# Patient Record
Sex: Female | Born: 1940 | Race: White | Hispanic: No | Smoking: Never smoker
Health system: Southern US, Community
[De-identification: ages and names within clinical notes are randomized; demographics above are authoritative.]

## PROBLEM LIST (undated history)

## (undated) DIAGNOSIS — Z9889 Other specified postprocedural states: Secondary | ICD-10-CM

## (undated) DIAGNOSIS — E785 Hyperlipidemia, unspecified: Secondary | ICD-10-CM

## (undated) DIAGNOSIS — K222 Esophageal obstruction: Secondary | ICD-10-CM

## (undated) DIAGNOSIS — C801 Malignant (primary) neoplasm, unspecified: Secondary | ICD-10-CM

## (undated) DIAGNOSIS — I10 Essential (primary) hypertension: Secondary | ICD-10-CM

## (undated) DIAGNOSIS — H409 Unspecified glaucoma: Secondary | ICD-10-CM

## (undated) DIAGNOSIS — Z972 Presence of dental prosthetic device (complete) (partial): Secondary | ICD-10-CM

## (undated) DIAGNOSIS — R112 Nausea with vomiting, unspecified: Secondary | ICD-10-CM

## (undated) DIAGNOSIS — M5030 Other cervical disc degeneration, unspecified cervical region: Secondary | ICD-10-CM

## (undated) DIAGNOSIS — K219 Gastro-esophageal reflux disease without esophagitis: Secondary | ICD-10-CM

## (undated) DIAGNOSIS — G629 Polyneuropathy, unspecified: Secondary | ICD-10-CM

## (undated) DIAGNOSIS — S065X9A Traumatic subdural hemorrhage with loss of consciousness of unspecified duration, initial encounter: Secondary | ICD-10-CM

## (undated) HISTORY — PX: COLON SURGERY: SHX602

## (undated) HISTORY — PX: ABDOMINAL HYSTERECTOMY: SHX81

## (undated) HISTORY — PX: CHOLECYSTECTOMY: SHX55

## (undated) HISTORY — PX: APPENDECTOMY: SHX54

---

## 2004-06-13 ENCOUNTER — Ambulatory Visit: Payer: Self-pay | Admitting: Gastroenterology

## 2004-11-25 ENCOUNTER — Ambulatory Visit: Payer: Self-pay | Admitting: Unknown Physician Specialty

## 2005-10-25 ENCOUNTER — Emergency Department: Payer: Self-pay | Admitting: Emergency Medicine

## 2005-12-01 ENCOUNTER — Ambulatory Visit: Payer: Self-pay | Admitting: Unknown Physician Specialty

## 2006-12-03 ENCOUNTER — Ambulatory Visit: Payer: Self-pay | Admitting: Unknown Physician Specialty

## 2007-07-06 ENCOUNTER — Ambulatory Visit: Payer: Self-pay | Admitting: Gastroenterology

## 2008-02-28 ENCOUNTER — Ambulatory Visit: Payer: Self-pay | Admitting: Unknown Physician Specialty

## 2008-06-12 DIAGNOSIS — S065XAA Traumatic subdural hemorrhage with loss of consciousness status unknown, initial encounter: Secondary | ICD-10-CM

## 2008-06-12 DIAGNOSIS — S065X9A Traumatic subdural hemorrhage with loss of consciousness of unspecified duration, initial encounter: Secondary | ICD-10-CM

## 2008-06-12 HISTORY — DX: Traumatic subdural hemorrhage with loss of consciousness of unspecified duration, initial encounter: S06.5X9A

## 2008-06-12 HISTORY — DX: Traumatic subdural hemorrhage with loss of consciousness status unknown, initial encounter: S06.5XAA

## 2008-06-19 ENCOUNTER — Emergency Department: Payer: Self-pay

## 2009-03-12 ENCOUNTER — Ambulatory Visit: Payer: Self-pay | Admitting: Unknown Physician Specialty

## 2010-05-20 ENCOUNTER — Ambulatory Visit: Payer: Self-pay | Admitting: Unknown Physician Specialty

## 2010-07-09 ENCOUNTER — Ambulatory Visit: Payer: Self-pay | Admitting: Gastroenterology

## 2011-05-08 ENCOUNTER — Inpatient Hospital Stay: Payer: Self-pay | Admitting: Surgery

## 2011-05-14 LAB — PATHOLOGY REPORT

## 2011-08-27 ENCOUNTER — Ambulatory Visit: Payer: Self-pay | Admitting: Unknown Physician Specialty

## 2012-09-16 ENCOUNTER — Ambulatory Visit: Payer: Self-pay | Admitting: Physician Assistant

## 2012-09-22 ENCOUNTER — Ambulatory Visit: Payer: Self-pay | Admitting: Gastroenterology

## 2013-04-04 ENCOUNTER — Emergency Department: Payer: Self-pay | Admitting: Emergency Medicine

## 2013-09-26 ENCOUNTER — Ambulatory Visit: Payer: Self-pay | Admitting: Physician Assistant

## 2013-11-03 ENCOUNTER — Ambulatory Visit: Payer: Self-pay | Admitting: Gastroenterology

## 2014-05-29 ENCOUNTER — Emergency Department: Payer: Self-pay | Admitting: Emergency Medicine

## 2014-05-29 LAB — BASIC METABOLIC PANEL
ANION GAP: 8 (ref 7–16)
BUN: 17 mg/dL (ref 7–18)
CALCIUM: 8.6 mg/dL (ref 8.5–10.1)
Chloride: 109 mmol/L — ABNORMAL HIGH (ref 98–107)
Co2: 25 mmol/L (ref 21–32)
Creatinine: 1.07 mg/dL (ref 0.60–1.30)
EGFR (Non-African Amer.): 53 — ABNORMAL LOW
Glucose: 111 mg/dL — ABNORMAL HIGH (ref 65–99)
Osmolality: 285 (ref 275–301)
Potassium: 4 mmol/L (ref 3.5–5.1)
Sodium: 142 mmol/L (ref 136–145)

## 2014-05-29 LAB — URINALYSIS, COMPLETE
BACTERIA: NONE SEEN
BLOOD: NEGATIVE
Bilirubin,UR: NEGATIVE
Glucose,UR: NEGATIVE mg/dL (ref 0–75)
Hyaline Cast: 2
Ketone: NEGATIVE
NITRITE: NEGATIVE
Ph: 5 (ref 4.5–8.0)
Protein: NEGATIVE
RBC,UR: 2 /HPF (ref 0–5)
Specific Gravity: 1.017 (ref 1.003–1.030)
Squamous Epithelial: 2
WBC UR: 3 /HPF (ref 0–5)

## 2014-05-29 LAB — CBC WITH DIFFERENTIAL/PLATELET
Basophil #: 0 10*3/uL (ref 0.0–0.1)
Basophil %: 0.9 %
EOS PCT: 1.5 %
Eosinophil #: 0.1 10*3/uL (ref 0.0–0.7)
HCT: 45 % (ref 35.0–47.0)
HGB: 15.3 g/dL (ref 12.0–16.0)
Lymphocyte #: 2.2 10*3/uL (ref 1.0–3.6)
Lymphocyte %: 42.3 %
MCH: 32 pg (ref 26.0–34.0)
MCHC: 33.9 g/dL (ref 32.0–36.0)
MCV: 94 fL (ref 80–100)
MONOS PCT: 6.2 %
Monocyte #: 0.3 x10 3/mm (ref 0.2–0.9)
NEUTROS PCT: 49.1 %
Neutrophil #: 2.5 10*3/uL (ref 1.4–6.5)
PLATELETS: 194 10*3/uL (ref 150–440)
RBC: 4.77 10*6/uL (ref 3.80–5.20)
RDW: 12.8 % (ref 11.5–14.5)
WBC: 5.1 10*3/uL (ref 3.6–11.0)

## 2014-05-29 LAB — TROPONIN I: Troponin-I: <0.02 ng/mL

## 2014-06-06 ENCOUNTER — Emergency Department: Payer: Self-pay | Admitting: Emergency Medicine

## 2014-09-03 NOTE — Discharge Summary (Signed)
PATIENT NAME:  Tiffany Stafford, Tiffany Stafford MR#:  [id number] DATE OF BIRTH:  [birth year]  DATE OF ADMISSION:  05/08/2011 DATE OF DISCHARGE:  05/12/2011  ADMITTING PHYSICIAN: Dr. Rochel Brome   ADMITTING DIAGNOSIS: Acute cholecystitis and cholelithiasis.   DISCHARGE DIAGNOSIS: Acute suppurative cholecystitis and cholelithiasis.   PROCEDURE: Laparoscopic cholecystectomy with cholangiogram on 05/09/2011.   HISTORY OF PRESENT ILLNESS: This is a 74 year old female who presented with acute abdominal pain. She had ultrasound findings of gallstones, thickened gallbladder wall. Surgery was recommended for definitive treatment.   HOSPITAL COURSE: The patient was admitted and got a dose of IV Invanz. Surgery was set up for later in the day on 12/28. She did well with surgery and was readmitted to the floor for recovery. On postoperative day #1 she was feeling much improved. She did not have any fever or increased white blood cell count. The surgical culture did not have any growth but she did receive several doses of antibiotics while in the hospital. She was able to start clear liquids on the evening after surgery and gradually advanced to regular food. Pain was controlled and she was ambulating well. She had a drain in place after surgery that had minimal drainage and was removed prior to discharge.   DISPOSITION: Discharged home in good condition with self care.   MEDICATIONS:  1. Resume home estrogen. 2. Use Tylenol or Vicodin if needed for pain.   INSTRUCTIONS:  1. Remove dressings tomorrow. May shower. Reapply if needed.  2. Resume regular diet.  3. No heavy lifting.  4. Follow up next week.  5. Seek medical attention if you have any increasing abdominal pain, nausea, vomiting, fever, or other concerns.  ____________________________ Celene Squibb. Hartman [place], [place] amc:cms D: 06/09/2011 13:00:41 ET T: 06/09/2011 13:06:55 ET JOB#: [id number]  cc: Celene Squibb. Theda Sers, [place], <Dictator>  Chales Pelissier M Mekaela Azizi PA ELECTRONICALLY  SIGNED 06/09/2011 14:20

## 2014-09-03 NOTE — Op Note (Signed)
PATIENT NAME:  Tiffany Stafford, Tiffany Stafford MR#:  [id number] DATE OF BIRTH:  [birth year]  DATE OF PROCEDURE:  05/09/2011  PREOPERATIVE DIAGNOSIS: Acute cholecystitis, cholelithiasis.   POSTOPERATIVE DIAGNOSIS: Acute suppurative cholecystitis, cholelithiasis.  OPERATION PERFORMED: Laparoscopic cholecystectomy, cholangiogram.   SURGEON: Rochel Brome, MD  ANESTHESIA: General.   INDICATIONS: This 74 year old was admitted emergently with acute abdominal pain. She had ultrasound findings of gallstones, thickened gallbladder wall and surgery was recommended for definitive treatment.   DESCRIPTION OF PROCEDURE: The patient was placed on the operating table in the supine position under general anesthesia. The abdomen was prepared with ChloraPrep and draped in a sterile manner. The gallbladder and liver edge were below the coastal margin. An infraumbilical incision was made and carried down through subcutaneous tissues. The deep fascia was grasped with laryngeal hook and elevated. A Veress needle was inserted, aspirated, and irrigated with a saline solution. Next, the peritoneal cavity was inflated with carbon dioxide. The Veress needle was removed. The 10 mm cannula was inserted. The 10 mm, 0 degree laparoscope was inserted to view the peritoneal cavity. Another incision was made in the epigastrium just to the right of the midline to introduce an 11 mm cannula. Two incisions were made in the lateral aspect of the right upper quadrant to introduce two 5-mm cannulas.   A portion of omentum was dissected away from the gallbladder and also away from the liver. This was done with blunt and sharp dissection. The gallbladder is found to be markedly distended with thickened wall and acutely inflamed, had some purulent discharge surrounding it. The patient was given intravenous dose of Invanz due to findings of acute suppurative cholecystitis for therapeutic IV antibiotic. The gallbladder was retracted towards the right shoulder.  The infundibulum was retracted inferiorly and laterally. Location of the porta hepatis was demonstrated. The cystic duct was dissected free from surrounding structures and the cystic artery was dissected free from surrounding structures. A critical view of safety was demonstrated. An endoclip was placed across the cystic duct adjacent to the neck of the gallbladder. An opening was made in the cystic duct to introduce a Reddick catheter. Cholangiogram was done with injection of half-strength Conray-60 dye. The cholangiogram demonstrated flow of dye into the duodenum. There were no retained stones seen. The Reddick catheter was removed. The cystic duct was doubly ligated with endoclips and divided. The cystic artery was controlled with double endoclips and divided. The gallbladder was dissected free from the liver with hook and cautery and this site was irrigated with heparinized saline solution and aspirated. The gallbladder was brought up through the infraumbilical incision, opened and suctioned. There was purulent fluid within the gallbladder. It is noted that some of the purulent fluid was submitted for culture, which was actually aspirated before the Colbert Ewing was given. There were three large stones in the gallbladder. It was necessary to lengthen the incision to about 3 cm in length which allowed removal of the gallbladder with the large stones. These were sent for routine pathology. The fascial closure was begun with 0 Maxon and reinserted the 10 mm cannula. Brought the laparoscope back to the umbilical port. I reinsufflated the abdomen and re-examine the operative area and irrigated with heparinized saline solution and aspirated. Hemostasis appeared to be intact. Next, a Blake drain was inserted and brought out through the lower most 5 mm port site and was attached to the skin with 3-0 nylon, placed into the gallbladder fossa. The remainder of the cannulas were removed. Carbon dioxide was  allowed to escape from  the peritoneal cavity. Skin incisions were closed with interrupted 5-0 chromic subcuticular sutures, benzoin, and Steri-Strips. The drain was activated. The patient appeared to tolerate the procedure satisfactorily and was then prepared for transfer to the recovery room.  ____________________________ Lenna Sciara. Rochel Brome, MD jws:rbg D: 05/21/2011 19:43:42 ET T: 05/22/2011 08:57:49 ET JOB#: [id number]  cc: Loreli Dollar, MD, <Dictator> Loreli Dollar MD ELECTRONICALLY SIGNED 06/11/2011 18:12

## 2014-09-03 NOTE — H&P (Signed)
PATIENT NAME:  Tiffany Stafford, BLOUCH MR#:  [id number] DATE OF BIRTH:  August 08, 1940  DATE OF ADMISSION:  05/08/2011  HISTORY OF PRESENT ILLNESS: This 74 year old female came in with chief complaint of severe abdominal pain. She reports that over a period of months she has had occasional right upper quadrant abdominal pain but then two nights ago she developed pain which persisted, initially seemed to be somewhat intermitted but then later more persistent and all day yesterday, became worse last night. She went to the Emergency Room where she was evaluated and referred for general surgery admission. She further notes some nausea but no vomiting. She has had no chills or fever. No history of jaundice.   PAST MEDICAL HISTORY: She reports no history of any heart disease or lung disease. No history of diabetes. No history of hypertension. Does have some history of anxiety, occasionally takes some medicine for anxiety which she cannot recall the name.   PAST SURGICAL HISTORY:  1. Partial colectomy in 1995, which was a mass but was benign.  2. History of varicose vein surgery. 3. Hysterectomy and one ovary removed.  4. Appendectomy when she was a teenager.   SOCIAL HISTORY: Her husband died about 2-1/2 years ago. She does not smoke. Does not drink any alcohol.   FAMILY HISTORY: Positive for stomach cancer in her mother.   REVIEW OF SYSTEMS: Recently has been eating satisfactorily before the onset of pain. She reports last meal she had was yesterday. She had a ham sandwich for lunch and just a minimal amount of liquid since then. She reports no other recent acute illness such as cough, cold, or sore throat. No chest pain. No dyspnea on exertion. Has been moving her bowels satisfactorily and voiding satisfactorily. She reports no recent sores or boils. Does have some occasional problems with anxiety needing to take her medicine which she has not home. Has had just some mild soreness in her throat the last two days  but no fever. Review of systems otherwise negative.   CURRENT MEDICATIONS:  1. Estrogen. 2. Another medicine for anxiety which she takes p.r.n.   ALLERGIES: Sulfa.  PHYSICAL EXAMINATION: GENERAL: She is awake, alert, oriented resting in her hospital bed.   VITAL SIGNS: Temperature 98, pulse 87, respirations 18, blood pressure 115/65.   SKIN: Warm and dry without rash or jaundice.   HEENT: Pupils equal, reactive to light. Extraocular movements are intact. Sclerae clear. Pharynx clear. Has dentures.   NECK: No palpable mass.   LUNGS: Lungs sounds were clear. No respiratory distress.   HEART: Regular rhythm, S1 and S2.   ABDOMEN: Abdomen with right upper quadrant tenderness with guarding. There is no tenderness elsewhere in the abdomen.   EXTREMITIES: No dependent edema.   NEUROLOGIC: Awake, alert, oriented, moving all extremities.   LABORATORY, DIAGNOSTIC AND RADIOLOGICAL DATA: Comprehensive metabolic panel with elevated glucose at 138, slightly low albumin of 3.2. Troponin normal. White blood count 7000, hemoglobin 13.7.   Ultrasound demonstrates nonmobile gallstones and gallbladder wall thickening as much as 1 cm. There is pericholecystic fluid. Positive sonographic Murphy sign. Common bile duct 8.2 mm. Pancreatic head appeared normal.   IMPRESSION: Acute cholecystitis, cholelithiasis.   RECOMMENDATIONS: I recommended laparoscopic cholecystectomy. I discussed the operation, care, risks and benefits with her in detail and will get that set up to do later today. Have discussed with Operating Room staff and will keep her n.p.o. for surgery.  ____________________________ Lenna Sciara. Rochel Brome, MD jws:cms D: 05/09/2011 07:48:10 ET T: 05/09/2011 08:11:38  ET JOB#: [id number]  cc: Loreli Dollar, MD, <Dictator> Loreli Dollar MD ELECTRONICALLY SIGNED 06/06/2011 16:29

## 2014-09-19 ENCOUNTER — Emergency Department: Payer: Medicare Other

## 2014-09-19 ENCOUNTER — Encounter: Payer: Self-pay | Admitting: Emergency Medicine

## 2014-09-19 ENCOUNTER — Emergency Department
Admission: EM | Admit: 2014-09-19 | Discharge: 2014-09-19 | Disposition: A | Discharge status: Home / Self Care | Payer: Medicare Other | Attending: Student | Admitting: Student

## 2014-09-19 DIAGNOSIS — W11XXXA Fall on and from ladder, initial encounter: Secondary | ICD-10-CM | POA: Insufficient documentation

## 2014-09-19 DIAGNOSIS — Y92096 Garden or yard of other non-institutional residence as the place of occurrence of the external cause: Secondary | ICD-10-CM | POA: Insufficient documentation

## 2014-09-19 DIAGNOSIS — Y998 Other external cause status: Secondary | ICD-10-CM | POA: Diagnosis not present

## 2014-09-19 DIAGNOSIS — S82832A Other fracture of upper and lower end of left fibula, initial encounter for closed fracture: Secondary | ICD-10-CM | POA: Diagnosis not present

## 2014-09-19 DIAGNOSIS — S8255XA Nondisplaced fracture of medial malleolus of left tibia, initial encounter for closed fracture: Secondary | ICD-10-CM | POA: Diagnosis not present

## 2014-09-19 DIAGNOSIS — Y93E5 Activity, floor mopping and cleaning: Secondary | ICD-10-CM | POA: Insufficient documentation

## 2014-09-19 DIAGNOSIS — S82892A Other fracture of left lower leg, initial encounter for closed fracture: Secondary | ICD-10-CM

## 2014-09-19 DIAGNOSIS — S99912A Unspecified injury of left ankle, initial encounter: Secondary | ICD-10-CM | POA: Diagnosis present

## 2014-09-19 MED ORDER — TRAMADOL HCL 50 MG PO TABS: 50.0000 mg | ORAL_TABLET | Freq: Three times a day (TID) | ORAL | Status: DC | PRN

## 2014-09-19 NOTE — Discharge Instructions (Signed)
Take medication as prescribed. Keep elevated and apply ice. Use walker and keep in splint. Do not put weight on ankle.   Follow up with Dr Rudene Christians orthopedic next week as scheduled. Call to schedule.   Return to ER for new or worsening concerns.   Ankle Fracture A fracture is a break in a bone. The ankle joint is made up of three bones. These include the lower (distal)sections of your lower leg bones, called the tibia and fibula, along with a bone in your foot, called the talus. Depending on how bad the break is and if more than one ankle joint bone is broken, a cast or splint is used to protect and keep your injured bone from moving while it heals. Sometimes, surgery is required to help the fracture heal properly.  There are two general types of fractures:  Stable fracture. This includes a single fracture line through one bone, with no injury to ankle ligaments. A fracture of the talus that does not have any displacement (movement of the bone on either side of the fracture line) is also stable.  Unstable fracture. This includes more than one fracture line through one or more bones in the ankle joint. It also includes fractures that have displacement of the bone on either side of the fracture line. CAUSES  A direct blow to the ankle.   Quickly and severely twisting your ankle.  Trauma, such as a car accident or falling from a significant height. RISK FACTORS You may be at a higher risk of ankle fracture if:  You have certain medical conditions.  You are involved in high-impact sports.  You are involved in a high-impact car accident. SIGNS AND SYMPTOMS   Tender and swollen ankle.  Bruising around the injured ankle.  Pain on movement of the ankle.  Difficulty walking or putting weight on the ankle.  A cold foot below the site of the ankle injury. This can occur if the blood vessels passing through your injured ankle were also damaged.  Numbness in the foot below the site of the  ankle injury. DIAGNOSIS  An ankle fracture is usually diagnosed with a physical exam and X-rays. A CT scan may also be required for complex fractures. TREATMENT  Stable fractures are treated with a cast or splint and using crutches to avoid putting weight on your injured ankle. This is followed by an ankle strengthening program. Some patients require a special type of cast, depending on other medical problems they may have. Unstable fractures require surgery to ensure the bones heal properly. Your health care provider will tell you what type of fracture you have and the best treatment for your condition. HOME CARE INSTRUCTIONS   Review correct crutch use with your health care provider and use your crutches as directed. Safe use of crutches is extremely important. Misuse of crutches can cause you to fall or cause injury to nerves in your hands or armpits.  Do not put weight or pressure on the injured ankle until directed by your health care provider.  To lessen the swelling, keep the injured leg elevated while sitting or lying down.  Apply ice to the injured area:  Put ice in a plastic bag.  Place a towel between your cast and the bag.  Leave the ice on for 20 minutes, 2-3 times a day.  If you have a plaster or fiberglass cast:  Do not try to scratch the skin under the cast with any objects. This can increase your risk  of skin infection.  Check the skin around the cast every day. You may put lotion on any red or sore areas.  Keep your cast dry and clean.  If you have a plaster splint:  Wear the splint as directed.  You may loosen the elastic around the splint if your toes become numb, tingle, or turn cold or blue.  Do not put pressure on any part of your cast or splint; it may break. Rest your cast only on a pillow the first 24 hours until it is fully hardened.  Your cast or splint can be protected during bathing with a plastic bag sealed to your skin with medical tape. Do not  lower the cast or splint into water.  Take medicines as directed by your health care provider. Only take over-the-counter or prescription medicines for pain, discomfort, or fever as directed by your health care provider.  Do not drive a vehicle until your health care provider specifically tells you it is safe to do so.  If your health care provider has given you a follow-up appointment, it is very important to keep that appointment. Not keeping the appointment could result in a chronic or permanent injury, pain, and disability. If you have any problem keeping the appointment, call the facility for assistance. SEEK MEDICAL CARE IF: You develop increased swelling or discomfort. SEEK IMMEDIATE MEDICAL CARE IF:   Your cast gets damaged or breaks.  You have continued severe pain.  You develop new pain or swelling after the cast was put on.  Your skin or toenails below the injury turn blue or gray.  Your skin or toenails below the injury feel cold, numb, or have loss of sensitivity to touch.  There is a bad smell or pus draining from under the cast. MAKE SURE YOU:   Understand these instructions.  Will watch your condition.  Will get help right away if you are not doing well or get worse. Document Released: 04/25/2000 Document Revised: 05/03/2013 Document Reviewed: 11/25/2012 [facility] Patient Information 2015 [place], [place]. This information is not intended to replace advice given to you by your health care provider. Make sure you discuss any questions you have with your health care provider.

## 2014-09-19 NOTE — ED Notes (Signed)
Pt was cleaning gutters, missed step on ladder and fell in yard,  C/o pain left ankle, ankle swollen, feet warm and pink, + pedal pulse

## 2014-09-19 NOTE — ED Provider Notes (Signed)
[facility] Emergency Department Provider Note  ____________________________________________  Time seen: Approximately 3:10 PM  I have reviewed the triage vital signs and the nursing notes.   HISTORY  Chief Complaint Fall and Ankle Pain   HPI Tiffany Stafford is a 74 y.o. female pglass resents to the ER status post fall. Patient reports that she was outside cleaning her gutters on ladder. States she was coming down ladder, she was on the last step and as she stepped to the ground she mis stepped and rolled left ankle. States she stepped down on her left ankle twisted her left ankle and fell to her side. States no other injuries other than left ankle. Denies head injury loss of consciousness neck or back pain.Complains of left ankle pain.   States she was able to get self up and ambulate in to home. States pain aching at 7/10 with weight bearing. States 3/10 currently. Denies other pain.   Daughter also at bedside.   History reviewed. No pertinent past medical history.  There are no active problems to display for this patient.   Past Surgical History  Procedure Laterality Date  . Abdominal hysterectomy    . Cholecystectomy    . Appendectomy      No current outpatient prescriptions on file.  Allergies Sulfa antibiotics  History reviewed. No pertinent family history.  Social History History  Substance Use Topics  . Smoking status: Never Smoker   . Smokeless tobacco: Not on file  . Alcohol Use: No    Review of Systems Constitutional: No fever/chills Eyes: No visual changes. ENT: No sore throat. Cardiovascular: Denies chest pain. Respiratory: Denies shortness of breath. Gastrointestinal: No abdominal pain.  No nausea, no vomiting.  No diarrhea.  No constipation. Genitourinary: Negative for dysuria. Musculoskeletal: positive for left ankle pain. Negative for back pain. Skin: Negative for rash. Neurological: Negative for headaches, focal  weakness or numbness.  10-point ROS otherwise negative.  ____________________________________________   PHYSICAL EXAM:  VITAL SIGNS: ED Triage Vitals  Enc Vitals Group     BP 09/19/14 1348 158/96 mmHg     Pulse Rate 09/19/14 1348 69     Resp --      Temp 09/19/14 1348 97.9 F (36.6 C)     Temp Source 09/19/14 1348 Oral     SpO2 09/19/14 1348 100 %     Weight 09/19/14 1348 139 lb (63.05 kg)     Height 09/19/14 1348 5\' 5"  (1.651 m)     Head Cir --      Peak Flow --      Pain Score 09/19/14 1349 7     Pain Loc --      Pain Edu? --      Excl. in [place]? --     Constitutional: Alert and oriented. Well appearing and in no acute distress. Eyes: Conjunctivae are normal. PERRL. EOMI. Head: Atraumatic. Nose: No congestion/rhinnorhea. Mouth/Throat: Mucous membranes are moist.  Oropharynx non-erythematous. Neck: No stridor. No cervical spine tenderness to palpation. Hematological/Lymphatic/Immunilogical: No cervical lymphadenopathy. Cardiovascular: Normal rate, regular rhythm. Grossly normal heart sounds.  Good peripheral circulation. Respiratory: Normal respiratory effort.  No retractions. Lungs CTAB. Gastrointestinal: Soft and nontender. No distention. No abdominal bruits. No CVA tenderness. Musculoskeletal: No lower extremity tenderness nor edema.  No joint effusions. Except: Left medial and lateral ankle mod TTP, mod swelling. Skin intact. Distal pedal pulses intact and equal bilaterally. Pain with rotation.  Neurologic:  Normal speech and language. No gross focal neurologic deficits  are appreciated. Speech is normal. No gait instability. Skin:  Skin is warm, dry and intact. No rash noted. Psychiatric: Mood and affect are normal. Speech and behavior are normal.   RADIOLOGY  LEFT ANKLE COMPLETE - 3+ VIEW  COMPARISON: None.  FINDINGS: Frontal, oblique, and lateral views were obtained. There is a fracture of the medial malleolus in essentially anatomic alignment. There is a  comminuted fracture of the distal fibular diaphysis with major fracture fragments in near anatomic alignment. There is generalized soft tissue swelling, particularly laterally. The ankle mortise appears intact. There is no appreciable joint effusion.  IMPRESSION: Essentially nondisplaced fracture medial malleolus. Comminuted fracture distal fibular diaphysis with major fracture fragments in near anatomic alignment. Ankle mortise appears grossly intact.   Electronically Signed By: Lowella Grip III M.D. On: 09/19/2014 14:30  ____________________________________________   INITIAL IMPRESSION / ASSESSMENT AND PLAN / ED COURSE  Pertinent labs & imaging results that were available during my care of the patient were reviewed by me and considered in my medical decision making (see chart for details).  This is a well-appearing high function 74 year old female presents status post mechanical fall. Patient presents with left nondisplaced fracture medial malleolus as well as comminuted fracture distal fibular. Discussed with Dr. Rudene Christians at 1540. Per Dr. Rudene Christians recommends place patient in posterior as well as stirrup OCL splint. Patient to remain nonweightbearing using a walker. Patient to follow up with Dr. Rudene Christians and office on Monday.Pt and daughter verbalized understanding and plan.  ____________________________________________   FINAL CLINICAL IMPRESSION(S) / ED DIAGNOSES  Final diagnoses:  Ankle fracture, left     Marylene Land, NP 09/19/14 [place], NP 09/19/14 1615  Joanne Gavel, MD 09/19/14 2351

## 2015-02-21 ENCOUNTER — Other Ambulatory Visit: Payer: Self-pay | Admitting: Orthopedic Surgery

## 2015-02-21 DIAGNOSIS — G8929 Other chronic pain: Secondary | ICD-10-CM

## 2015-02-21 DIAGNOSIS — M25572 Pain in left ankle and joints of left foot: Principal | ICD-10-CM

## 2015-03-01 ENCOUNTER — Ambulatory Visit
Admission: RE | Admit: 2015-03-01 | Discharge: 2015-03-01 | Disposition: A | Discharge status: Home / Self Care | Payer: Medicare Other | Source: Ambulatory Visit | Attending: Orthopedic Surgery | Admitting: Orthopedic Surgery

## 2015-03-01 DIAGNOSIS — M25572 Pain in left ankle and joints of left foot: Secondary | ICD-10-CM | POA: Diagnosis present

## 2015-03-01 DIAGNOSIS — G8929 Other chronic pain: Secondary | ICD-10-CM

## 2015-03-01 DIAGNOSIS — X58XXXA Exposure to other specified factors, initial encounter: Secondary | ICD-10-CM | POA: Diagnosis not present

## 2015-03-01 DIAGNOSIS — S82452A Displaced comminuted fracture of shaft of left fibula, initial encounter for closed fracture: Secondary | ICD-10-CM | POA: Diagnosis not present

## 2015-04-11 ENCOUNTER — Encounter
Admission: RE | Admit: 2015-04-11 | Discharge: 2015-04-11 | Disposition: A | Discharge status: Home / Self Care | Payer: Medicare Other | Source: Ambulatory Visit | Attending: Orthopedic Surgery | Admitting: Orthopedic Surgery

## 2015-04-11 DIAGNOSIS — S82832K Other fracture of upper and lower end of left fibula, subsequent encounter for closed fracture with nonunion: Secondary | ICD-10-CM | POA: Diagnosis present

## 2015-04-11 DIAGNOSIS — Z0189 Encounter for other specified special examinations: Secondary | ICD-10-CM | POA: Insufficient documentation

## 2015-04-11 HISTORY — DX: Other specified postprocedural states: R11.2

## 2015-04-11 HISTORY — DX: Other specified postprocedural states: Z98.890

## 2015-04-11 NOTE — Patient Instructions (Signed)
  Your procedure is scheduled on: 04/17/15 Report to Day Surgery. To find out your arrival time please call [phone] between 1PM - 3PM on 04/16/15  Remember: Instructions that are not followed completely may result in serious medical risk, up to and including death, or upon the discretion of your surgeon and anesthesiologist your surgery may need to be rescheduled.    _x___ 1. Do not eat food or drink liquids after midnight. No gum chewing or hard candies.     __x__ 2. No Alcohol for 24 hours before or after surgery.   ____ 3. Bring all medications with you on the day of surgery if instructed.    _x___ 4. Notify your doctor if there is any change in your medical condition     (cold, fever, infections).     Do not wear jewelry, make-up, hairpins, clips or nail polish.  Do not wear lotions, powders, or perfumes. You may wear deodorant.  Do not shave 48 hours prior to surgery. Men may shave face and neck.  Do not bring valuables to the hospital.    [facility] is not responsible for any belongings or valuables.               Contacts, dentures or bridgework may not be worn into surgery.  Leave your suitcase in the car. After surgery it may be brought to your room.  For patients admitted to the hospital, discharge time is determined by your                treatment team.   Patients discharged the day of surgery will not be allowed to drive home.   Please read over the following fact sheets that you were given:   Surgical Site Infection Prevention   ____ Take these medicines the morning of surgery with A SIP OF WATER:    1.   2.   3.   4.  5.  6.  ____ Fleet Enema (as directed)   _x___ Use CHG Soap as directed  ____ Use inhalers on the day of surgery  ____ Stop metformin 2 days prior to surgery    ____ Take 1/2 of usual insulin dose the night before surgery and none on the morning of surgery.   ____ Stop Coumadin/Plavix/aspirin on   __x__ Stop Anti-inflammatories on   Tylenol only for pain before surgery   ____ Stop supplements until after surgery.    ____ Bring C-Pap to the hospital.

## 2015-04-17 ENCOUNTER — Ambulatory Visit: Payer: Medicare Other | Admitting: Registered Nurse

## 2015-04-17 ENCOUNTER — Ambulatory Visit
Admission: RE | Admit: 2015-04-17 | Discharge: 2015-04-17 | Disposition: A | Discharge status: Home / Self Care | Payer: Medicare Other | Source: Ambulatory Visit | Attending: Orthopedic Surgery | Admitting: Orthopedic Surgery

## 2015-04-17 ENCOUNTER — Ambulatory Visit: Payer: Medicare Other

## 2015-04-17 ENCOUNTER — Encounter
Admission: RE | Disposition: A | Discharge status: Home / Self Care | Payer: Self-pay | Source: Ambulatory Visit | Attending: Orthopedic Surgery

## 2015-04-17 DIAGNOSIS — Z9889 Other specified postprocedural states: Secondary | ICD-10-CM

## 2015-04-17 DIAGNOSIS — S82492K Other fracture of shaft of left fibula, subsequent encounter for closed fracture with nonunion: Secondary | ICD-10-CM | POA: Diagnosis present

## 2015-04-17 DIAGNOSIS — M1990 Unspecified osteoarthritis, unspecified site: Secondary | ICD-10-CM | POA: Insufficient documentation

## 2015-04-17 DIAGNOSIS — G629 Polyneuropathy, unspecified: Secondary | ICD-10-CM | POA: Insufficient documentation

## 2015-04-17 DIAGNOSIS — H409 Unspecified glaucoma: Secondary | ICD-10-CM | POA: Insufficient documentation

## 2015-04-17 DIAGNOSIS — Z85038 Personal history of other malignant neoplasm of large intestine: Secondary | ICD-10-CM | POA: Diagnosis not present

## 2015-04-17 DIAGNOSIS — Z8781 Personal history of (healed) traumatic fracture: Secondary | ICD-10-CM

## 2015-04-17 DIAGNOSIS — X58XXXD Exposure to other specified factors, subsequent encounter: Secondary | ICD-10-CM | POA: Diagnosis not present

## 2015-04-17 DIAGNOSIS — Z87820 Personal history of traumatic brain injury: Secondary | ICD-10-CM | POA: Diagnosis not present

## 2015-04-17 HISTORY — PX: ORIF ANKLE FRACTURE: SHX5408

## 2015-04-17 SURGERY — OPEN REDUCTION INTERNAL FIXATION (ORIF) ANKLE FRACTURE
Anesthesia: General | Laterality: Left

## 2015-04-17 MED ORDER — FAMOTIDINE 20 MG PO TABS
ORAL_TABLET | ORAL | Status: AC
  Filled 2015-04-17: qty 1

## 2015-04-17 MED ORDER — DEXAMETHASONE SODIUM PHOSPHATE 4 MG/ML IJ SOLN
INTRAMUSCULAR | Status: DC | PRN
  Administered 2015-04-17: 5 mg via INTRAVENOUS

## 2015-04-17 MED ORDER — FENTANYL CITRATE (PF) 100 MCG/2ML IJ SOLN
INTRAMUSCULAR | Status: AC
  Administered 2015-04-17: 25 ug via INTRAVENOUS
  Filled 2015-04-17: qty 2

## 2015-04-17 MED ORDER — FENTANYL CITRATE (PF) 100 MCG/2ML IJ SOLN
25.0000 ug | INTRAMUSCULAR | Status: AC | PRN
  Administered 2015-04-17 (×6): 25 ug via INTRAVENOUS

## 2015-04-17 MED ORDER — FENTANYL CITRATE (PF) 100 MCG/2ML IJ SOLN
INTRAMUSCULAR | Status: AC
Start: 2015-04-17 — End: 2015-04-17
  Administered 2015-04-17: 25 ug via INTRAVENOUS
  Filled 2015-04-17: qty 2

## 2015-04-17 MED ORDER — FAMOTIDINE 20 MG PO TABS
20.0000 mg | ORAL_TABLET | Freq: Once | ORAL | Status: AC
  Administered 2015-04-17: 20 mg via ORAL

## 2015-04-17 MED ORDER — HYDROCODONE-ACETAMINOPHEN 5-325 MG PO TABS: 1.0000 | ORAL_TABLET | ORAL | Status: DC | PRN

## 2015-04-17 MED ORDER — ONDANSETRON HCL 4 MG/2ML IJ SOLN: 4.0000 mg | Freq: Four times a day (QID) | INTRAMUSCULAR | Status: DC | PRN

## 2015-04-17 MED ORDER — ONDANSETRON HCL 4 MG/2ML IJ SOLN
INTRAMUSCULAR | Status: AC
  Filled 2015-04-17: qty 2

## 2015-04-17 MED ORDER — PROPOFOL 10 MG/ML IV BOLUS
INTRAVENOUS | Status: DC | PRN
  Administered 2015-04-17: 30 mg via INTRAVENOUS
  Administered 2015-04-17: 50 mg via INTRAVENOUS
  Administered 2015-04-17: 100 mg via INTRAVENOUS

## 2015-04-17 MED ORDER — ONDANSETRON HCL 4 MG/2ML IJ SOLN
4.0000 mg | Freq: Once | INTRAMUSCULAR | Status: AC | PRN
  Administered 2015-04-17: 4 mg via INTRAVENOUS

## 2015-04-17 MED ORDER — ONDANSETRON HCL 4 MG PO TABS: 4.0000 mg | ORAL_TABLET | Freq: Four times a day (QID) | ORAL | Status: DC | PRN

## 2015-04-17 MED ORDER — LIDOCAINE HCL (CARDIAC) 20 MG/ML IV SOLN
INTRAVENOUS | Status: DC | PRN
Start: 2015-04-17 — End: 2015-04-17
  Administered 2015-04-17: 50 mg via INTRAVENOUS

## 2015-04-17 MED ORDER — CEFAZOLIN SODIUM 1-5 GM-% IV SOLN
1.0000 g | Freq: Once | INTRAVENOUS | Status: AC
  Administered 2015-04-17: 1 g via INTRAVENOUS

## 2015-04-17 MED ORDER — PROMETHAZINE HCL 25 MG/ML IJ SOLN
INTRAMUSCULAR | Status: AC
  Filled 2015-04-17: qty 1

## 2015-04-17 MED ORDER — SODIUM CHLORIDE 0.9 % IV SOLN: INTRAVENOUS | Status: DC

## 2015-04-17 MED ORDER — FENTANYL CITRATE (PF) 100 MCG/2ML IJ SOLN
INTRAMUSCULAR | Status: DC | PRN
  Administered 2015-04-17 (×2): 50 ug via INTRAVENOUS

## 2015-04-17 MED ORDER — SODIUM CHLORIDE 0.9 % IJ SOLN
INTRAMUSCULAR | Status: AC
  Filled 2015-04-17: qty 10

## 2015-04-17 MED ORDER — ONDANSETRON HCL 4 MG/2ML IJ SOLN
INTRAMUSCULAR | Status: DC | PRN
  Administered 2015-04-17: 4 mg via INTRAVENOUS

## 2015-04-17 MED ORDER — GLYCOPYRROLATE 0.2 MG/ML IJ SOLN
INTRAMUSCULAR | Status: DC | PRN
  Administered 2015-04-17: 0.2 mg via INTRAVENOUS

## 2015-04-17 MED ORDER — CEFAZOLIN SODIUM 1-5 GM-% IV SOLN
INTRAVENOUS | Status: AC
  Filled 2015-04-17: qty 50

## 2015-04-17 MED ORDER — LACTATED RINGERS IV SOLN
INTRAVENOUS | Status: DC
  Administered 2015-04-17: 10:00:00 via INTRAVENOUS

## 2015-04-17 MED ORDER — METOCLOPRAMIDE HCL 10 MG PO TABS: 5.0000 mg | ORAL_TABLET | Freq: Three times a day (TID) | ORAL | Status: DC | PRN

## 2015-04-17 MED ORDER — NEOMYCIN-POLYMYXIN B GU 40-200000 IR SOLN
Status: AC
  Filled 2015-04-17: qty 4

## 2015-04-17 MED ORDER — PROMETHAZINE HCL 25 MG/ML IJ SOLN
6.2500 mg | Freq: Once | INTRAMUSCULAR | Status: AC
  Administered 2015-04-17: 6.25 mg via INTRAVENOUS

## 2015-04-17 MED ORDER — HYDROCODONE-ACETAMINOPHEN 5-325 MG PO TABS: 1.0000 | ORAL_TABLET | Freq: Four times a day (QID) | ORAL | Status: DC | PRN

## 2015-04-17 MED ORDER — METOCLOPRAMIDE HCL 5 MG/ML IJ SOLN: 5.0000 mg | Freq: Three times a day (TID) | INTRAMUSCULAR | Status: DC | PRN

## 2015-04-17 SURGICAL SUPPLY — 51 items
BANDAGE ACE 4X5 VEL STRL LF (GAUZE/BANDAGES/DRESSINGS) ×6 IMPLANT
BIT DRILL 2.5X2.75 QC CALB (BIT) ×3 IMPLANT
BLADE SURG SZ10 CARB STEEL (BLADE) ×6 IMPLANT
BNDG ESMARK 4X12 TAN STRL LF (GAUZE/BANDAGES/DRESSINGS) ×3 IMPLANT
CANISTER SUCT 1200ML W/VALVE (MISCELLANEOUS) ×3 IMPLANT
CHLORAPREP W/TINT 26ML (MISCELLANEOUS) ×3 IMPLANT
DRAPE FLUOR MINI C-ARM 54X84 (DRAPES) ×3 IMPLANT
DRAPE INCISE IOBAN 66X45 STRL (DRAPES) ×3 IMPLANT
DRAPE U-SHAPE 47X51 STRL (DRAPES) ×3 IMPLANT
DRSG EMULSION OIL 3X8 NADH (GAUZE/BANDAGES/DRESSINGS) ×3 IMPLANT
ELECT CAUTERY BLADE 6.4 (BLADE) ×3 IMPLANT
GAUZE PETRO XEROFOAM 1X8 (MISCELLANEOUS) ×3 IMPLANT
GAUZE SPONGE 4X4 12PLY STRL (GAUZE/BANDAGES/DRESSINGS) ×3 IMPLANT
GLOVE BIOGEL PI IND STRL 9 (GLOVE) ×1 IMPLANT
GLOVE BIOGEL PI INDICATOR 9 (GLOVE) ×2
GLOVE INDICATOR 7.5 STRL GRN (GLOVE) ×3 IMPLANT
GLOVE SURG ORTHO 9.0 STRL STRW (GLOVE) ×3 IMPLANT
GOWN SPECIALTY ULTRA XL (MISCELLANEOUS) ×3 IMPLANT
GOWN STRL REUS W/ TWL LRG LVL3 (GOWN DISPOSABLE) ×1 IMPLANT
GOWN STRL REUS W/TWL LRG LVL3 (GOWN DISPOSABLE) ×2
HEMOVAC 400ML (MISCELLANEOUS) ×3
KIT DRAIN HEMOVAC JP 7FR 400ML (MISCELLANEOUS) ×1 IMPLANT
KIT RM TURNOVER STRD PROC AR (KITS) ×3 IMPLANT
LABEL OR SOLS (LABEL) ×3 IMPLANT
NS IRRIG 1000ML POUR BTL (IV SOLUTION) ×3 IMPLANT
PACK EXTREMITY ARMC (MISCELLANEOUS) ×3 IMPLANT
PAD ABD DERMACEA PRESS 5X9 (GAUZE/BANDAGES/DRESSINGS) ×6 IMPLANT
PAD CAST CTTN 4X4 STRL (SOFTGOODS) ×2 IMPLANT
PAD GROUND ADULT SPLIT (MISCELLANEOUS) ×3 IMPLANT
PAD PREP 24X41 OB/GYN DISP (PERSONAL CARE ITEMS) ×3 IMPLANT
PADDING CAST COTTON 4X4 STRL (SOFTGOODS) ×4
PLATE ACE 100DEG 6HOLE (Plate) ×3 IMPLANT
SCREW CORTICAL 3.5MM  12MM (Screw) ×2 IMPLANT
SCREW CORTICAL 3.5MM  16MM (Screw) ×4 IMPLANT
SCREW CORTICAL 3.5MM 12MM (Screw) ×1 IMPLANT
SCREW CORTICAL 3.5MM 14MM (Screw) ×6 IMPLANT
SCREW CORTICAL 3.5MM 16MM (Screw) ×2 IMPLANT
SCREW CORTICAL 3.5MM 18MM (Screw) ×3 IMPLANT
SPLINT CAST 1 STEP 5X30 WHT (MISCELLANEOUS) ×3 IMPLANT
SPONGE LAP 18X18 5 PK (GAUZE/BANDAGES/DRESSINGS) ×3 IMPLANT
STAPLER SKIN PROX 35W (STAPLE) ×3 IMPLANT
STOCKINETTE STRL 6IN 960660 (GAUZE/BANDAGES/DRESSINGS) ×3 IMPLANT
SUT ETHILON 3-0 FS-10 30 BLK (SUTURE) ×3
SUT MNCRL AB 4-0 PS2 18 (SUTURE) ×6 IMPLANT
SUT VIC AB 0 CT1 36 (SUTURE) ×3 IMPLANT
SUT VIC AB 2-0 SH 27 (SUTURE) ×4
SUT VIC AB 2-0 SH 27XBRD (SUTURE) ×2 IMPLANT
SUT VIC AB 3-0 SH 27 (SUTURE) ×2
SUT VIC AB 3-0 SH 27X BRD (SUTURE) ×1 IMPLANT
SUTURE EHLN 3-0 FS-10 30 BLK (SUTURE) ×1 IMPLANT
SYRINGE 10CC LL (SYRINGE) ×3 IMPLANT

## 2015-04-17 NOTE — Discharge Instructions (Addendum)
Keep leg elevated much as possible. Weightbearing as tolerated on the left leg. Keep dressing clean and dry      - USE YOUR WALKER FOR SAFETY         AMBULATORY SURGERY  DISCHARGE INSTRUCTIONS   1) The drugs that you were given will stay in your system until tomorrow so for the next 24 hours you should not:  A) Drive an automobile B) Make any legal decisions C) Drink any alcoholic beverage   2) You may resume regular meals tomorrow.  Today it is better to start with liquids and gradually work up to solid foods.  You may eat anything you prefer, but it is better to start with liquids, then soup and crackers, and gradually work up to solid foods.   3) Please notify your doctor immediately if you have any unusual bleeding, trouble breathing, redness and pain at the surgery site, drainage, fever, or pain not relieved by medication. 4)   5) Your post-operative visit with Dr.                                     is: Date:                        Time:    Please call to schedule your post-operative visit.  6) Additional Instructions:

## 2015-04-17 NOTE — H&P (Signed)
Reviewed paper H+P, will be scanned into chart. No changes noted.  

## 2015-04-17 NOTE — Transfer of Care (Signed)
Immediate Anesthesia Transfer of Care Note  Patient: Tiffany Stafford  Procedure(s) Performed: Procedure(s): OPEN REDUCTION INTERNAL FIXATION (ORIF) ANKLE FRACTURE (Left)  Patient Location: PACU  Anesthesia Type:General  Level of Consciousness: sedated  Airway & Oxygen Therapy: Patient Spontanous Breathing and Patient connected to face mask oxygen  Post-op Assessment: Report given to RN and Post -op Vital signs reviewed and stable  Post vital signs: Reviewed and stable  Last Vitals:  Filed Vitals:   04/17/15 1003 04/17/15 1157  BP: 174/86 168/89  Pulse: 62 76  Temp: 36.2 C 36.4 C  Resp: 16 11    Complications: No apparent anesthesia complications

## 2015-04-17 NOTE — Anesthesia Preprocedure Evaluation (Addendum)
Anesthesia Evaluation  Patient identified by MRN, date of birth, ID band Patient awake    Reviewed: Allergy & Precautions, NPO status , Patient's Chart, lab work & pertinent test results, reviewed documented beta blocker date and time   History of Anesthesia Complications (+) PONV and history of anesthetic complications  Airway Mallampati: II  TM Distance: >3 FB     Dental  (+) Chipped   Pulmonary           Cardiovascular      Neuro/Psych    GI/Hepatic   Endo/Other    Renal/GU      Musculoskeletal   Abdominal   Peds  Hematology   Anesthesia Other Findings EKG ok, no cardiac problems. Neuropathy. Arthrits. Hx of colon ca, with resection. Hx of subdural hematoma years ago, ok now. Glaucoma with drops. VV.  Reproductive/Obstetrics                            Anesthesia Physical Anesthesia Plan  ASA: II  Anesthesia Plan: General and Spinal   Post-op Pain Management:    Induction: Intravenous  Airway Management Planned: LMA  Additional Equipment:   Intra-op Plan:   Post-operative Plan:   Informed Consent: I have reviewed the patients History and Physical, chart, labs and discussed the procedure including the risks, benefits and alternatives for the proposed anesthesia with the patient or authorized representative who has indicated his/her understanding and acceptance.     Plan Discussed with: CRNA  Anesthesia Plan Comments:         Anesthesia Quick Evaluation

## 2015-04-17 NOTE — Anesthesia Postprocedure Evaluation (Signed)
Anesthesia Post Note  Patient: Tiffany Stafford  Procedure(s) Performed: Procedure(s) (LRB): OPEN REDUCTION INTERNAL FIXATION (ORIF) ANKLE FRACTURE (Left)  Patient location during evaluation: PACU Anesthesia Type: General Level of consciousness: awake Pain management: pain level controlled Vital Signs Assessment: post-procedure vital signs reviewed and stable Respiratory status: spontaneous breathing Cardiovascular status: blood pressure returned to baseline Anesthetic complications: no    Last Vitals:  Filed Vitals:   04/17/15 1316 04/17/15 1344  BP:  157/82  Pulse: 73   Temp:  35.5 C  Resp: 13 16    Last Pain:  Filed Vitals:   04/17/15 1348  PainSc: [place]

## 2015-04-17 NOTE — Anesthesia Procedure Notes (Signed)
Procedure Name: LMA Insertion Performed by: Rolla Plate Pre-anesthesia Checklist: Patient identified, Patient being monitored, Timeout performed, Emergency Drugs available and Suction available Patient Re-evaluated:Patient Re-evaluated prior to inductionOxygen Delivery Method: Circle system utilized Preoxygenation: Pre-oxygenation with 100% oxygen Intubation Type: IV induction LMA: LMA inserted LMA Size: 3.0 Tube type: Oral Number of attempts: 1 Placement Confirmation: positive ETCO2 and breath sounds checked- equal and bilateral Tube secured with: Tape Dental Injury: Teeth and Oropharynx as per pre-operative assessment

## 2015-04-17 NOTE — Op Note (Signed)
04/17/2015  11:53 AM  PATIENT:  Tiffany Stafford  74 y.o. female  PRE-OPERATIVE DIAGNOSIS:  NONUNION LEFT DISTAL FIBULAR FX  POST-OPERATIVE DIAGNOSIS:  Same  PROCEDURE:  Procedure(s): OPEN REDUCTION INTERNAL FIXATION (ORIF) ANKLE FRACTURE (Left)  SURGEON: Laurene Footman, MD  ASSISTANTS: None  ANESTHESIA:   general  EBL:  Total I/O In: 500 [I.V.:500] Out: 10 [Blood:10]  BLOOD ADMINISTERED:none  DRAINS: none   LOCAL MEDICATIONS USED:  NONE  SPECIMEN:  No Specimen  DISPOSITION OF SPECIMEN:  N/A  COUNTS:  YES  TOURNIQUET:   20 minutes at 300 mmHg  IMPLANTS: Biomet 1/3 tubular plate 6-hole with 6 screws  DICTATION: .Dragon Dictation patient brought the operating room and after adequate general anesthesia was obtained, the left leg had a tourniquet applied to the upper thigh and a bump underneath left buttock to internally rotate the leg. After prepping draping the sterile fashion appropriate patient identification and timeout procedures were completed. Tourniquet was raised at*the case. A lateral approach was made to the distal fibula with mini C-arm being utilized to be certain that the appropriate level. The fracture site and nonunion were exposed the fracture line was visible a osteotome was used to remove some of the callus present to allow for application of the plate directly to the lateral surface of the fibula. 6-hole plate was contoured to fit and this filled sequentially with cortical screws drilling measuring and placing the self-tapping screws. Fixation appeared rigid and without any penetration into the joint or syndesmosis. The wound was thoroughly irrigated. Closed with 2-0 Vicryl and skin staples. Xeroform 4 x 4's web roll and Ace wrap applied. Tourniquet was let down to close of the case there is no significant bleeding.  PLAN OF CARE: Discharge to home after PACU  PATIENT DISPOSITION:  PACU - hemodynamically stable.

## 2015-04-18 ENCOUNTER — Encounter: Payer: Self-pay | Admitting: Orthopedic Surgery

## 2015-08-06 ENCOUNTER — Other Ambulatory Visit: Payer: Self-pay | Admitting: Physician Assistant

## 2015-08-06 DIAGNOSIS — Z1231 Encounter for screening mammogram for malignant neoplasm of breast: Secondary | ICD-10-CM

## 2015-08-13 ENCOUNTER — Other Ambulatory Visit: Payer: Self-pay | Admitting: Physician Assistant

## 2015-08-13 ENCOUNTER — Ambulatory Visit
Admission: RE | Admit: 2015-08-13 | Discharge: 2015-08-13 | Disposition: A | Discharge status: Home / Self Care | Payer: Medicare Other | Source: Ambulatory Visit | Attending: Physician Assistant | Admitting: Physician Assistant

## 2015-08-13 DIAGNOSIS — Z1231 Encounter for screening mammogram for malignant neoplasm of breast: Secondary | ICD-10-CM | POA: Insufficient documentation

## 2015-09-14 ENCOUNTER — Encounter: Payer: Self-pay | Admitting: *Deleted

## 2015-09-17 ENCOUNTER — Ambulatory Visit: Payer: Medicare Other | Admitting: Anesthesiology

## 2015-09-17 ENCOUNTER — Encounter: Payer: Self-pay | Admitting: *Deleted

## 2015-09-17 ENCOUNTER — Ambulatory Visit
Admission: RE | Admit: 2015-09-17 | Discharge: 2015-09-17 | Disposition: A | Discharge status: Home / Self Care | Payer: Medicare Other | Source: Ambulatory Visit | Attending: Gastroenterology | Admitting: Gastroenterology

## 2015-09-17 ENCOUNTER — Encounter
Admission: RE | Disposition: A | Discharge status: Home / Self Care | Payer: Self-pay | Source: Ambulatory Visit | Attending: Gastroenterology

## 2015-09-17 DIAGNOSIS — K219 Gastro-esophageal reflux disease without esophagitis: Secondary | ICD-10-CM | POA: Diagnosis not present

## 2015-09-17 DIAGNOSIS — R1310 Dysphagia, unspecified: Secondary | ICD-10-CM | POA: Insufficient documentation

## 2015-09-17 DIAGNOSIS — Z90710 Acquired absence of both cervix and uterus: Secondary | ICD-10-CM | POA: Insufficient documentation

## 2015-09-17 DIAGNOSIS — H409 Unspecified glaucoma: Secondary | ICD-10-CM | POA: Diagnosis not present

## 2015-09-17 DIAGNOSIS — Z9049 Acquired absence of other specified parts of digestive tract: Secondary | ICD-10-CM | POA: Insufficient documentation

## 2015-09-17 DIAGNOSIS — I10 Essential (primary) hypertension: Secondary | ICD-10-CM | POA: Diagnosis not present

## 2015-09-17 DIAGNOSIS — D120 Benign neoplasm of cecum: Secondary | ICD-10-CM | POA: Insufficient documentation

## 2015-09-17 DIAGNOSIS — M5030 Other cervical disc degeneration, unspecified cervical region: Secondary | ICD-10-CM | POA: Insufficient documentation

## 2015-09-17 DIAGNOSIS — Z1211 Encounter for screening for malignant neoplasm of colon: Secondary | ICD-10-CM | POA: Diagnosis not present

## 2015-09-17 DIAGNOSIS — Z9889 Other specified postprocedural states: Secondary | ICD-10-CM | POA: Diagnosis not present

## 2015-09-17 DIAGNOSIS — Z85038 Personal history of other malignant neoplasm of large intestine: Secondary | ICD-10-CM | POA: Insufficient documentation

## 2015-09-17 DIAGNOSIS — K222 Esophageal obstruction: Secondary | ICD-10-CM | POA: Diagnosis not present

## 2015-09-17 DIAGNOSIS — E785 Hyperlipidemia, unspecified: Secondary | ICD-10-CM | POA: Diagnosis not present

## 2015-09-17 DIAGNOSIS — K449 Diaphragmatic hernia without obstruction or gangrene: Secondary | ICD-10-CM | POA: Insufficient documentation

## 2015-09-17 DIAGNOSIS — Z882 Allergy status to sulfonamides status: Secondary | ICD-10-CM | POA: Diagnosis not present

## 2015-09-17 DIAGNOSIS — Z86010 Personal history of colonic polyps: Secondary | ICD-10-CM | POA: Diagnosis present

## 2015-09-17 DIAGNOSIS — G629 Polyneuropathy, unspecified: Secondary | ICD-10-CM | POA: Diagnosis not present

## 2015-09-17 HISTORY — DX: Gastro-esophageal reflux disease without esophagitis: K21.9

## 2015-09-17 HISTORY — DX: Hyperlipidemia, unspecified: E78.5

## 2015-09-17 HISTORY — DX: Polyneuropathy, unspecified: G62.9

## 2015-09-17 HISTORY — DX: Traumatic subdural hemorrhage with loss of consciousness of unspecified duration, initial encounter: S06.5X9A

## 2015-09-17 HISTORY — PX: ESOPHAGOGASTRODUODENOSCOPY (EGD) WITH PROPOFOL: SHX5813

## 2015-09-17 HISTORY — DX: Esophageal obstruction: K22.2

## 2015-09-17 HISTORY — PX: COLONOSCOPY WITH PROPOFOL: SHX5780

## 2015-09-17 HISTORY — DX: Unspecified glaucoma: H40.9

## 2015-09-17 HISTORY — DX: Essential (primary) hypertension: I10

## 2015-09-17 HISTORY — DX: Other cervical disc degeneration, unspecified cervical region: M50.30

## 2015-09-17 HISTORY — DX: Malignant (primary) neoplasm, unspecified: C80.1

## 2015-09-17 SURGERY — COLONOSCOPY WITH PROPOFOL
Anesthesia: General

## 2015-09-17 MED ORDER — LIDOCAINE HCL (CARDIAC) 20 MG/ML IV SOLN
INTRAVENOUS | Status: DC | PRN
  Administered 2015-09-17: 100 mg via INTRAVENOUS

## 2015-09-17 MED ORDER — PROPOFOL 500 MG/50ML IV EMUL
INTRAVENOUS | Status: DC | PRN
  Administered 2015-09-17: 140 ug/kg/min via INTRAVENOUS

## 2015-09-17 MED ORDER — SODIUM CHLORIDE 0.9 % IV SOLN
INTRAVENOUS | Status: DC
Start: 2015-09-17 — End: 2015-09-17

## 2015-09-17 MED ORDER — SODIUM CHLORIDE 0.9 % IV SOLN
INTRAVENOUS | Status: DC
  Administered 2015-09-17: 1000 mL via INTRAVENOUS

## 2015-09-17 MED ORDER — GLYCOPYRROLATE 0.2 MG/ML IJ SOLN
INTRAMUSCULAR | Status: DC | PRN
  Administered 2015-09-17: 0.2 mg via INTRAVENOUS

## 2015-09-17 MED ORDER — PROPOFOL 10 MG/ML IV BOLUS
INTRAVENOUS | Status: DC | PRN
  Administered 2015-09-17: 40 mg via INTRAVENOUS
  Administered 2015-09-17: 60 mg via INTRAVENOUS

## 2015-09-17 MED ORDER — PROPOFOL 10 MG/ML IV BOLUS: INTRAVENOUS | Status: DC | PRN

## 2015-09-17 NOTE — Op Note (Signed)
[facility] Patient Name: Tiffany Stafford Procedure Date: 09/17/2015 10:28 AM MRN: [id number] Account #: [id number] Date of Birth: 1941-04-07 Admit Type: Outpatient Age: 75 Room: [facility] ENDO ROOM 4 Gender: Female Note Status: Finalized Procedure:            Colonoscopy Indications:          Personal history of colonic polyps Providers:            Lupita Dawn. Candace Cruise, MD Referring MD:         Precious Bard, MD (Referring MD) Medicines:            Monitored Anesthesia Care Complications:        No immediate complications. Procedure:            Pre-Anesthesia Assessment:                       - Prior to the procedure, a History and Physical was                        performed, and patient medications, allergies and                        sensitivities were reviewed. The patient's tolerance of                        previous anesthesia was reviewed.                       - The risks and benefits of the procedure and the                        sedation options and risks were discussed with the                        patient. All questions were answered and informed                        consent was obtained.                       - After reviewing the risks and benefits, the patient                        was deemed in satisfactory condition to undergo the                        procedure.                       After obtaining informed consent, the colonoscope was                        passed under direct vision. Throughout the procedure,                        the patient's blood pressure, pulse, and oxygen                        saturations were monitored continuously. The  Colonoscope was introduced through the anus and                        advanced to the the cecum, identified by appendiceal                        orifice and ileocecal valve. The colonoscopy was                        performed without difficulty. The patient  tolerated the                        procedure well. The quality of the bowel preparation                        was fair. Findings:      A diminutive polyp was found in the cecum. The polyp was sessile. The       polyp was removed with a jumbo cold forceps. Resection and retrieval       were complete.      The exam was otherwise without abnormality. Impression:           - Preparation of the colon was fair.                       - One diminutive polyp in the cecum, removed with a                        jumbo cold forceps. Resected and retrieved.                       - The examination was otherwise normal. Recommendation:       - Discharge patient to home.                       - Await pathology results.                       - Repeat colonoscopy in 5 years for surveillance based                        on pathology results.                       - The findings and recommendations were discussed with                        the patient. Procedure Code(s):    --- Professional ---                       [phone], Colonoscopy, flexible; with biopsy, single or                        multiple Diagnosis Code(s):    --- Professional ---                       D12.0, Benign neoplasm of cecum                       Z86.010, Personal history of colonic polyps CPT copyright 2016 American Medical Association. All rights reserved.  The codes documented in this report are preliminary and upon coder review may  be revised to meet current compliance requirements. Hulen Luster, MD 09/17/2015 10:56:16 AM This report has been signed electronically. Number of Addenda: 0 Note Initiated On: 09/17/2015 10:28 AM Scope Withdrawal Time: 0 hours 7 minutes 49 seconds  Total Procedure Duration: 0 hours 9 minutes 41 seconds       [facility]

## 2015-09-17 NOTE — H&P (Signed)
    Primary Care Physician:  Marinda Elk, MD Primary Gastroenterologist:  Dr. Candace Cruise  Pre-Procedure History & Physical: HPI:  Tiffany Stafford is a 75 y.o. female is here for an EGD, colonoscopy  Past Medical History  Diagnosis Date  . PONV (postoperative nausea and vomiting)   . Benign esophageal stricture   . DDD (degenerative disc disease), cervical   . Neuropathy ([place])   . GERD (gastroesophageal reflux disease)   . Glaucoma   . Hyperlipidemia   . Subdural hematoma ([facility]) 2/10  . Hypertension   . Cancer [facility])     colon cancer    Past Surgical History  Procedure Laterality Date  . Abdominal hysterectomy    . Cholecystectomy    . Appendectomy    . Orif ankle fracture Left 04/17/2015    Procedure: OPEN REDUCTION INTERNAL FIXATION (ORIF) ANKLE FRACTURE;  Surgeon: Hessie Knows, MD;  Location: [facility] ORS;  Service: Orthopedics;  Laterality: Left;  . Colon surgery      Prior to Admission medications   Not on File    Allergies as of 09/05/2015 - Review Complete 04/17/2015  Allergen Reaction Noted  . Sulfa antibiotics Other (See Comments) 09/19/2014    History reviewed. No pertinent family history.  Social History   Social History  . Marital Status: Married    Spouse Name: N/A  . Number of Children: N/A  . Years of Education: N/A   Occupational History  . Not on file.   Social History Main Topics  . Smoking status: Never Smoker   . Smokeless tobacco: Not on file  . Alcohol Use: No  . Drug Use: No  . Sexual Activity: Not on file   Other Topics Concern  . Not on file   Social History Narrative    Review of Systems: See HPI, otherwise negative ROS  Physical Exam: There were no vitals taken for this visit. General:   Alert,  pleasant and cooperative in NAD Head:  Normocephalic and atraumatic. Neck:  Supple; no masses or thyromegaly. Lungs:  Clear throughout to auscultation.    Heart:  Regular rate and rhythm. Abdomen:  Soft, nontender and  nondistended. Normal bowel sounds, without guarding, and without rebound.   Neurologic:  Alert and  oriented x4;  grossly normal neurologically.  Impression/Plan: Tiffany Stafford is here for an colonoscopy/EGD to be performed for personal hx of colon polyps, dysphagia Risks, benefits, limitations, and alternatives regarding EGD/colonoscopy have been reviewed with the patient.  Questions have been answered.  All parties agreeable.   Mariem Skolnick, Lupita Dawn, MD  09/17/2015, 10:02 AM

## 2015-09-17 NOTE — Op Note (Signed)
[facility] Patient Name: Tiffany Stafford Procedure Date: 09/17/2015 10:28 AM MRN: [id number] Account #: [id number] Date of Birth: 01/18/1941 Admit Type: Outpatient Age: 75 Room: [facility] ENDO ROOM 4 Gender: Female Note Status: Finalized Procedure:            Upper GI endoscopy Indications:          Dysphagia Providers:            Lupita Dawn. Candace Cruise, MD Referring MD:         Precious Bard, MD (Referring MD) Medicines:            Monitored Anesthesia Care Complications:        No immediate complications. Procedure:            Pre-Anesthesia Assessment:                       - Prior to the procedure, a History and Physical was                        performed, and patient medications, allergies and                        sensitivities were reviewed. The patient's tolerance of                        previous anesthesia was reviewed.                       - The risks and benefits of the procedure and the                        sedation options and risks were discussed with the                        patient. All questions were answered and informed                        consent was obtained.                       - After reviewing the risks and benefits, the patient                        was deemed in satisfactory condition to undergo the                        procedure.                       After obtaining informed consent, the endoscope was                        passed under direct vision. Throughout the procedure,                        the patient's blood pressure, pulse, and oxygen                        saturations were monitored continuously. The Endoscope  was introduced through the mouth, and advanced to the                        second part of duodenum. The upper GI endoscopy was                        accomplished without difficulty. The patient tolerated                        the procedure well. Findings:      One  moderate benign-appearing, intrinsic stenosis was found at the       gastroesophageal junction. And was traversed. The scope was withdrawn.       Dilation was performed with a Maloney dilator with mild resistance at 24       Fr.      The exam was otherwise without abnormality.      A medium-sized hiatal hernia was present.      The exam was otherwise without abnormality.      The examined duodenum was normal. Impression:           - Benign-appearing esophageal stenosis. Dilated.                       - The examination was otherwise normal.                       - Medium-sized hiatal hernia.                       - The examination was otherwise normal.                       - Normal examined duodenum.                       - No specimens collected. Recommendation:       - Discharge patient to home.                       - Observe patient's clinical course.                       - The findings and recommendations were discussed with                        the patient. Procedure Code(s):    --- Professional ---                       [phone], Esophagogastroduodenoscopy, flexible, transoral;                        diagnostic, including collection of specimen(s) by                        brushing or washing, when performed (separate procedure)                       [id number], Dilation of esophagus, by unguided sound or                        bougie, single or multiple passes Diagnosis Code(s):    --- Professional ---  K22.2, Esophageal obstruction                       K44.9, Diaphragmatic hernia without obstruction or                        gangrene                       R13.10, Dysphagia, unspecified CPT copyright 2016 American Medical Association. All rights reserved. The codes documented in this report are preliminary and upon coder review may  be revised to meet current compliance requirements. Hulen Luster, MD 09/17/2015 10:43:16 AM This report has been signed  electronically. Number of Addenda: 0 Note Initiated On: 09/17/2015 10:28 AM      [facility]

## 2015-09-17 NOTE — Anesthesia Preprocedure Evaluation (Signed)
Anesthesia Evaluation  Patient identified by MRN, date of birth, ID band Patient awake    Reviewed: Allergy & Precautions, H&P , NPO status , Patient's Chart, lab work & pertinent test results  History of Anesthesia Complications (+) PONV and history of anesthetic complications  Airway Mallampati: III  TM Distance: <3 FB Neck ROM: limited    Dental  (+) Poor Dentition, Chipped, Missing, Upper Dentures   Pulmonary neg pulmonary ROS, neg shortness of breath,    Pulmonary exam normal breath sounds clear to auscultation       Cardiovascular Exercise Tolerance: Good hypertension, (-) angina(-) Past MI and (-) DOE Normal cardiovascular exam Rhythm:regular Rate:Normal     Neuro/Psych negative neurological ROS  negative psych ROS   GI/Hepatic Neg liver ROS, GERD  Controlled,  Endo/Other  negative endocrine ROS  Renal/GU negative Renal ROS  negative genitourinary   Musculoskeletal  (+) Arthritis ,   Abdominal   Peds  Hematology negative hematology ROS (+)   Anesthesia Other Findings Past Medical History:   PONV (postoperative nausea and vomiting)                     Benign esophageal stricture                                  DDD (degenerative disc disease), cervical                    Neuropathy (HCC)                                             GERD (gastroesophageal reflux disease)                       Glaucoma                                                     Hyperlipidemia                                               Subdural hematoma ([facility])                         2/10         Hypertension                                                 Cancer ([facility])                                                   Comment:colon cancer  Past Surgical History:   ABDOMINAL HYSTERECTOMY  CHOLECYSTECTOMY                                               APPENDECTOMY                                                   ORIF ANKLE FRACTURE                             Left 04/17/2015      Comment:Procedure: OPEN REDUCTION INTERNAL FIXATION               (ORIF) ANKLE FRACTURE;  Surgeon: Hessie Knows,               MD;  Location: [facility] ORS;  Service: Orthopedics;              Laterality: Left;   COLON SURGERY                                                BMI    Body Mass Index   21.63 kg/m 2      Reproductive/Obstetrics negative OB ROS                             Anesthesia Physical Anesthesia Plan  ASA: III  Anesthesia Plan: General   Post-op Pain Management:    Induction:   Airway Management Planned:   Additional Equipment:   Intra-op Plan:   Post-operative Plan:   Informed Consent: I have reviewed the patients History and Physical, chart, labs and discussed the procedure including the risks, benefits and alternatives for the proposed anesthesia with the patient or authorized representative who has indicated his/her understanding and acceptance.   Dental Advisory Given  Plan Discussed with: Anesthesiologist, CRNA and Surgeon  Anesthesia Plan Comments:         Anesthesia Quick Evaluation

## 2015-09-17 NOTE — Transfer of Care (Signed)
Immediate Anesthesia Transfer of Care Note  Patient: Tiffany Stafford  Procedure(s) Performed: Procedure(s): COLONOSCOPY WITH PROPOFOL (N/A) ESOPHAGOGASTRODUODENOSCOPY (EGD) WITH PROPOFOL (N/A)  Patient Location: Endoscopy Unit  Anesthesia Type:General  Level of Consciousness: sedated  Airway & Oxygen Therapy: Patient connected to nasal cannula oxygen  Post-op Assessment: Post -op Vital signs reviewed and stable  Post vital signs: stable  Last Vitals:  Filed Vitals:   09/17/15 1003 09/17/15 1059  BP: 172/96 132/79  Pulse: 81 80  Temp: 36 C 36 C  Resp: 18 14    Last Pain: There were no vitals filed for this visit.       Complications: No apparent anesthesia complications

## 2015-09-17 NOTE — Anesthesia Postprocedure Evaluation (Signed)
Anesthesia Post Note  Patient: MCKINNLEY BALLINGER  Procedure(s) Performed: Procedure(s) (LRB): COLONOSCOPY WITH PROPOFOL (N/A) ESOPHAGOGASTRODUODENOSCOPY (EGD) WITH PROPOFOL (N/A)  Patient location during evaluation: Endoscopy Anesthesia Type: General Level of consciousness: awake and alert Pain management: pain level controlled Vital Signs Assessment: post-procedure vital signs reviewed and stable Respiratory status: spontaneous breathing, nonlabored ventilation, respiratory function stable and patient connected to nasal cannula oxygen Cardiovascular status: blood pressure returned to baseline and stable Postop Assessment: no signs of nausea or vomiting Anesthetic complications: no    Last Vitals:  Filed Vitals:   09/17/15 1129 09/17/15 1139  BP: 146/85 152/84  Pulse: 67 66  Temp:    Resp: 14 15    Last Pain:  Filed Vitals:   09/17/15 1140  PainSc: Asleep                 Precious Haws Piscitello

## 2015-09-18 ENCOUNTER — Encounter: Payer: Self-pay | Admitting: Gastroenterology

## 2015-09-19 LAB — SURGICAL PATHOLOGY

## 2016-02-24 IMAGING — CR RIGHT FOREARM - 2 VIEW
1 series · 2 of 2 positions shown · non-contrast
Comparison: None.

CLINICAL DATA: Fall with arm laceration and swelling.

EXAM:
RIGHT FOREARM - 2 VIEW

[Series 1: dxr forearm right · 0.14mm/px · 2 of 2 slices shown]
[im 1/2]
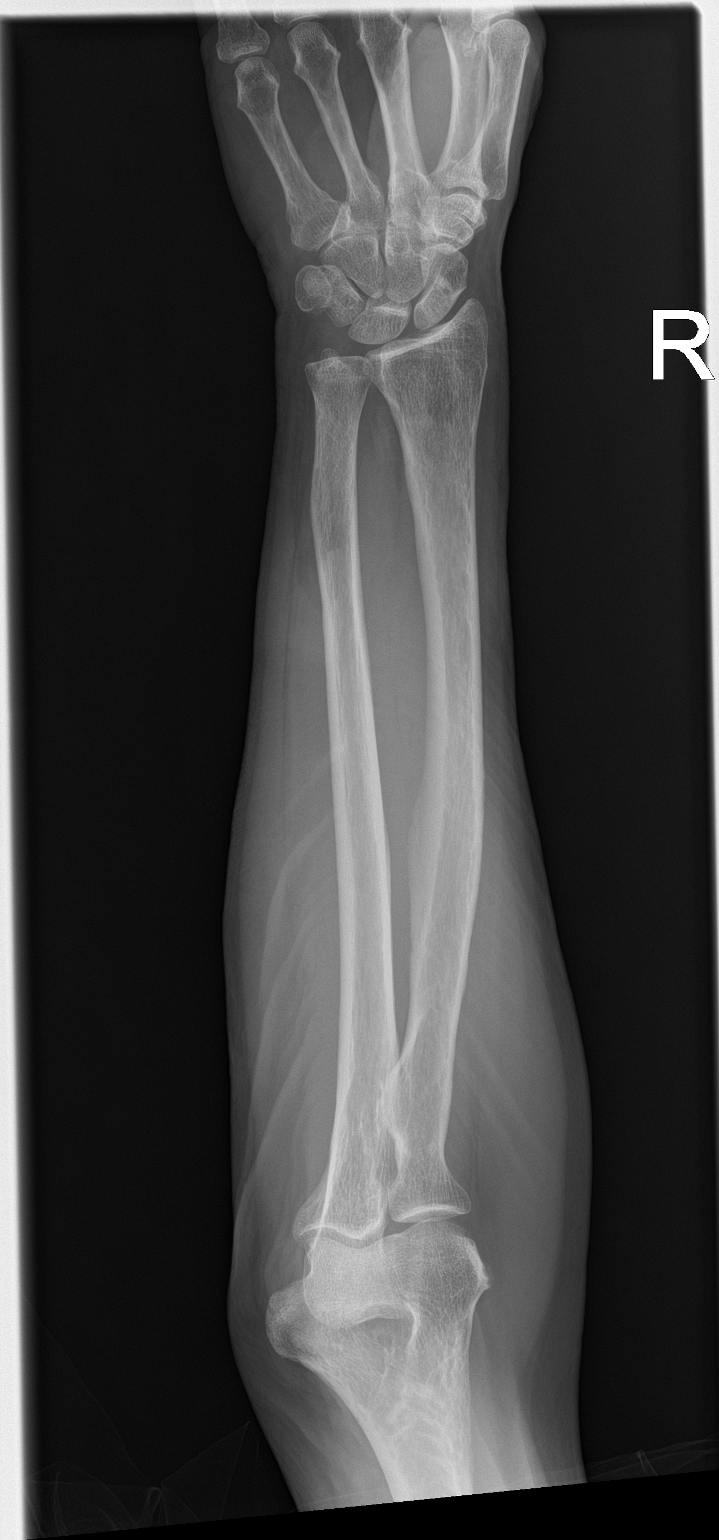
[im 2/2]
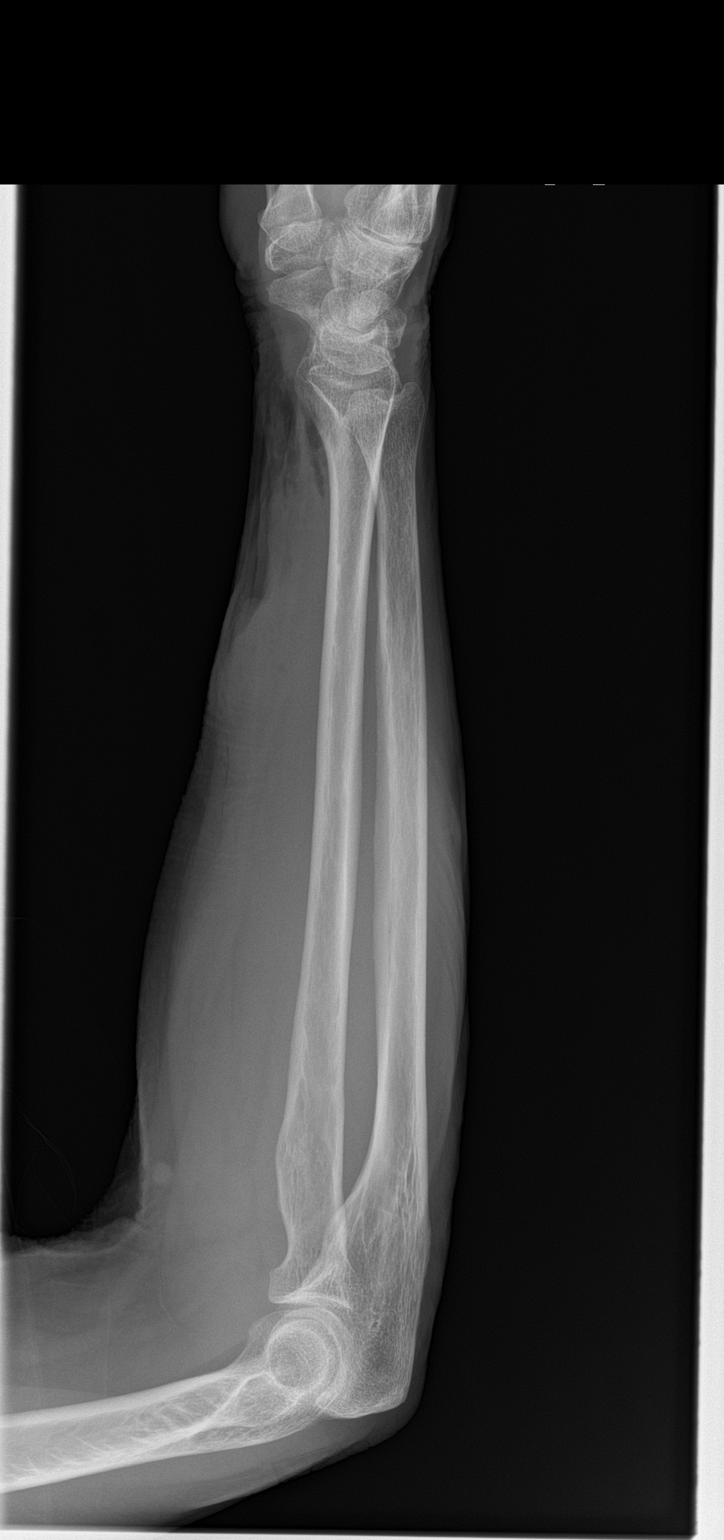

[2 of 2 positions shown; findings below may reference images not displayed]

FINDINGS: There is extensive subcutaneous gas ventral to the distal forearm
and wrist. There is no radiopaque foreign body or fracture.
Alignment is normal.
IMPRESSION: Ventral forearm laceration without opaque foreign body or fracture.

## 2016-02-24 IMAGING — CT CT HEAD WITHOUT CONTRAST
2 series · 14 of 30 positions shown, 16 images · non-contrast
Comparison: 06/19/2008

CLINICAL DATA: Syncope

EXAM:
CT HEAD WITHOUT CONTRAST
TECHNIQUE: Contiguous axial images were obtained from the base of the skull
through the vertex without intravenous contrast.

[Series 2: head wo · axial · 0.44mm/px · z∈[-85,+31]mm · 6 of 36 slices shown, 8 images]
[im 6/36  brain]
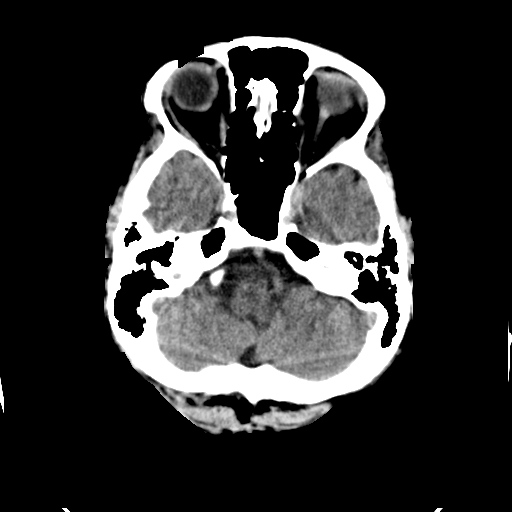
[im 6/36  bone]
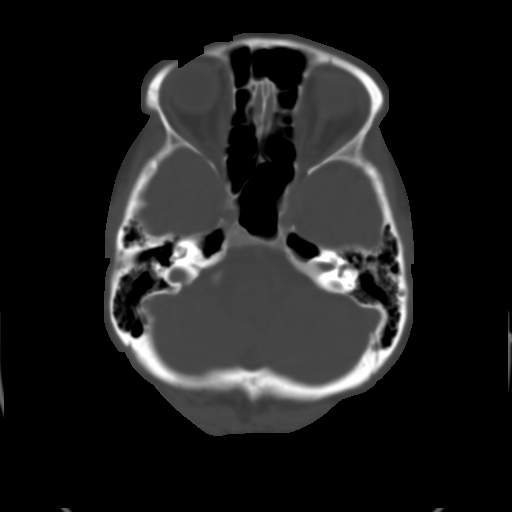
[im 11/36  brain]
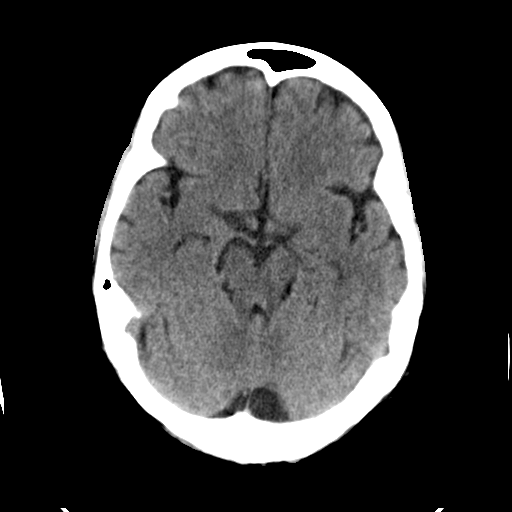
[im 16/36  brain]
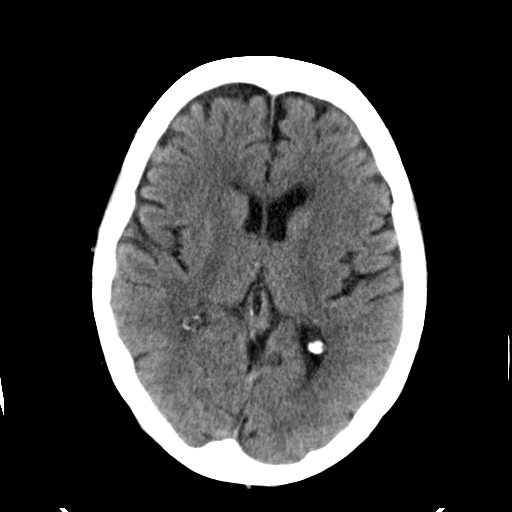
[im 21/36  brain]
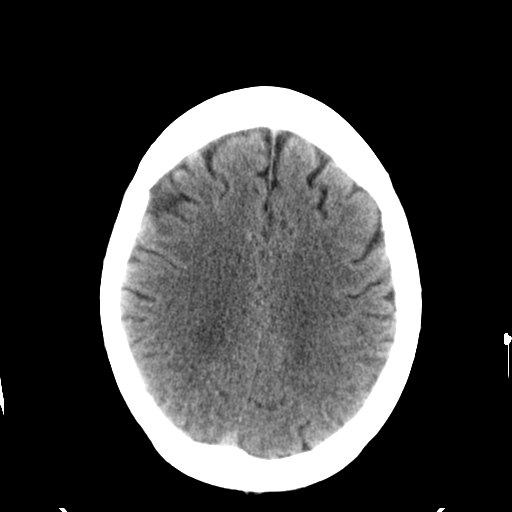
[im 26/36  brain]
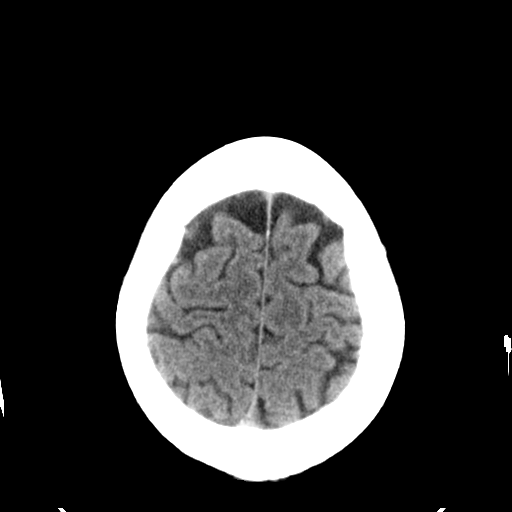
[im 26/36  bone]
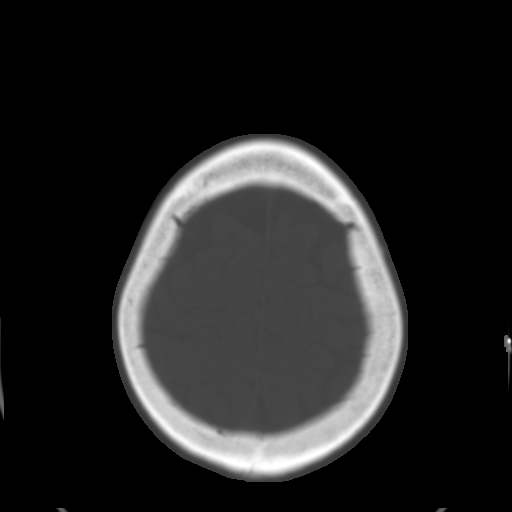
[im 31/36  brain]
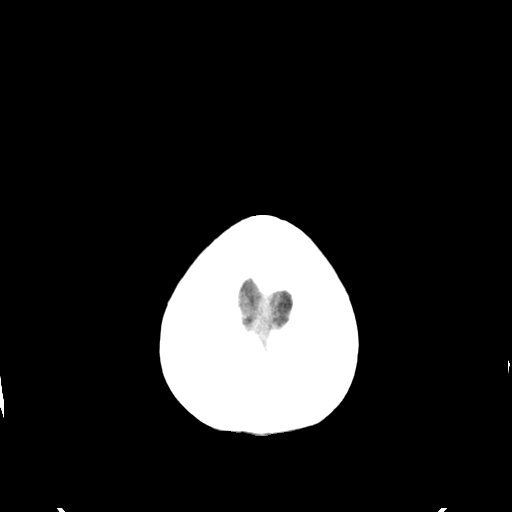

[Series 3: head bone · axial · 0.44mm/px · z∈[-94,+39]mm · 8 of 108 slices shown]
[im 11/108  bone]
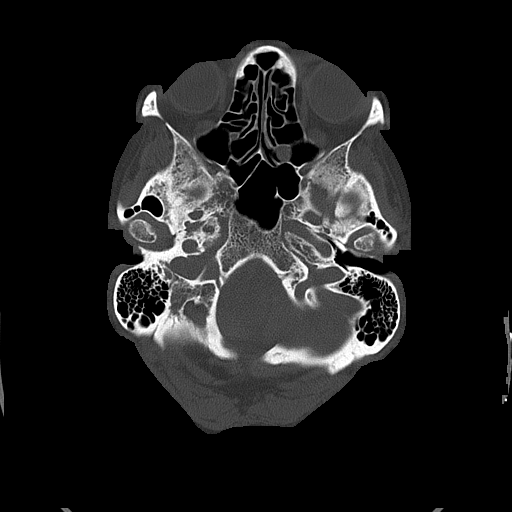
[im 21/108  bone]
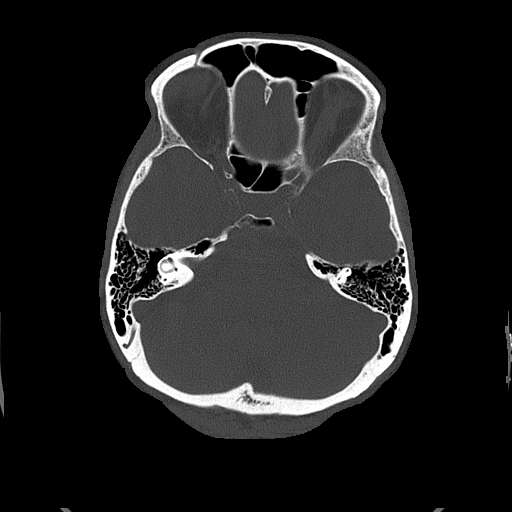
[im 36/108  bone]
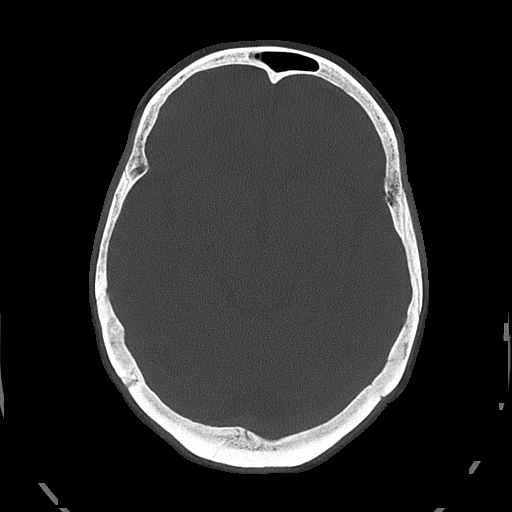
[im 46/108  bone]
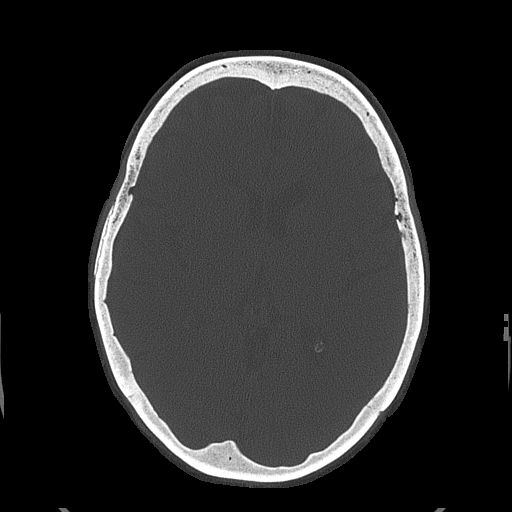
[im 62/108  bone]
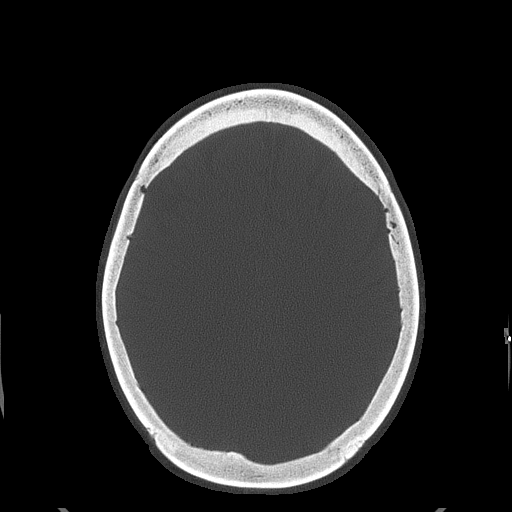
[im 72/108  bone]
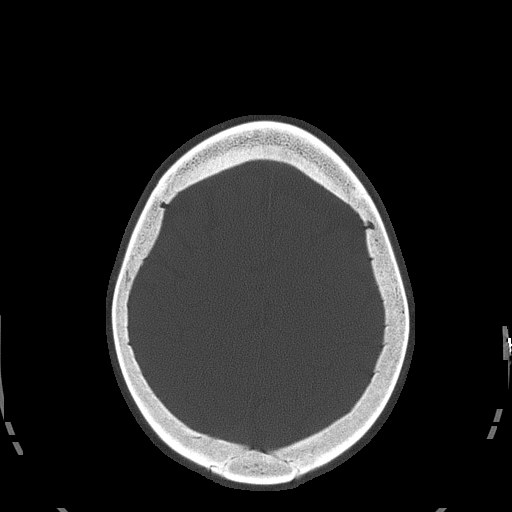
[im 87/108  bone]
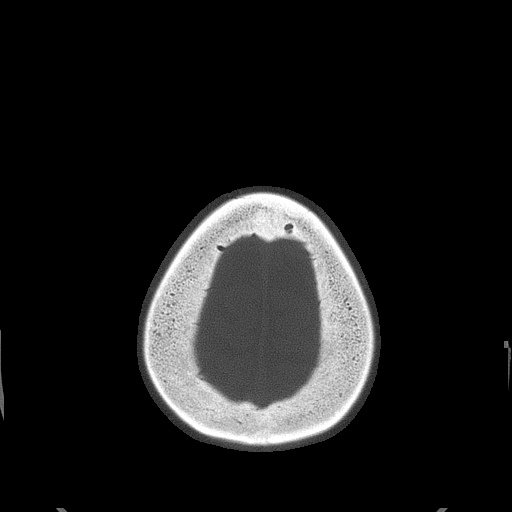
[im 97/108  bone]
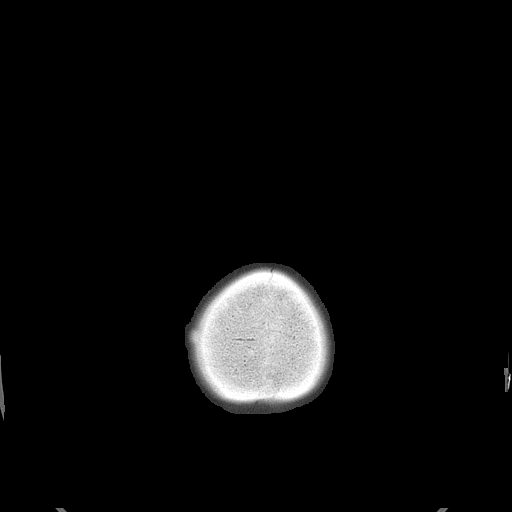

[14 of 30 positions shown; findings below may reference images not displayed]

FINDINGS: Skull and Sinuses:Negative for fracture or destructive process. The
mastoids, middle ears, and imaged paranasal sinuses are clear.

Orbits: No acute abnormality.

Brain: No evidence of acute infarction, hemorrhage, hydrocephalus,
or mass lesion/mass effect.
IMPRESSION: Negative head CT.

## 2016-07-07 ENCOUNTER — Other Ambulatory Visit: Payer: Self-pay | Admitting: Physician Assistant

## 2016-10-09 ENCOUNTER — Other Ambulatory Visit: Payer: Self-pay | Admitting: Physician Assistant

## 2016-10-09 DIAGNOSIS — Z1231 Encounter for screening mammogram for malignant neoplasm of breast: Secondary | ICD-10-CM

## 2016-10-31 ENCOUNTER — Ambulatory Visit
Admission: RE | Admit: 2016-10-31 | Discharge: 2016-10-31 | Disposition: A | Discharge status: Home / Self Care | Payer: Medicare Other | Source: Ambulatory Visit | Attending: Physician Assistant | Admitting: Physician Assistant

## 2016-10-31 DIAGNOSIS — Z1231 Encounter for screening mammogram for malignant neoplasm of breast: Secondary | ICD-10-CM | POA: Diagnosis not present

## 2017-01-12 IMAGING — CR DG ANKLE 2V *L*
1 series · 2 of 2 positions shown · non-contrast
Comparison: 03/01/2015 MRI

CLINICAL DATA: ORIF of the ankle.

EXAM:
LEFT ANKLE - 2 VIEW

[Series 1: ap · 0.17mm/px · 2 of 2 slices shown]
[im 1/2]
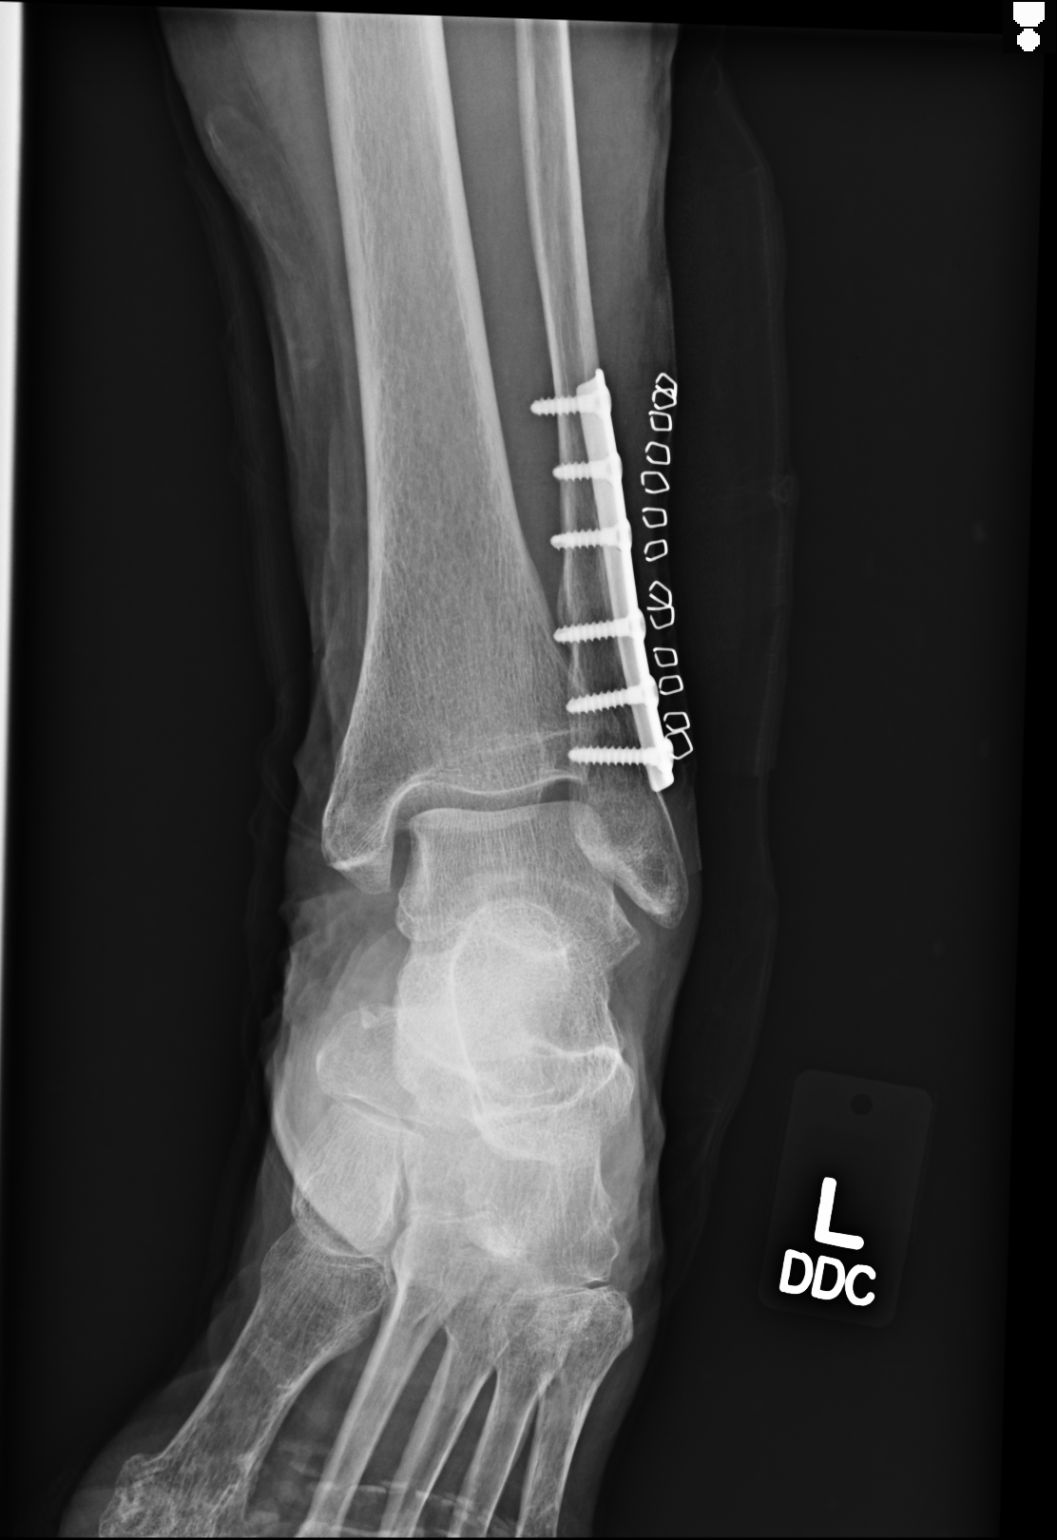
[im 2/2]
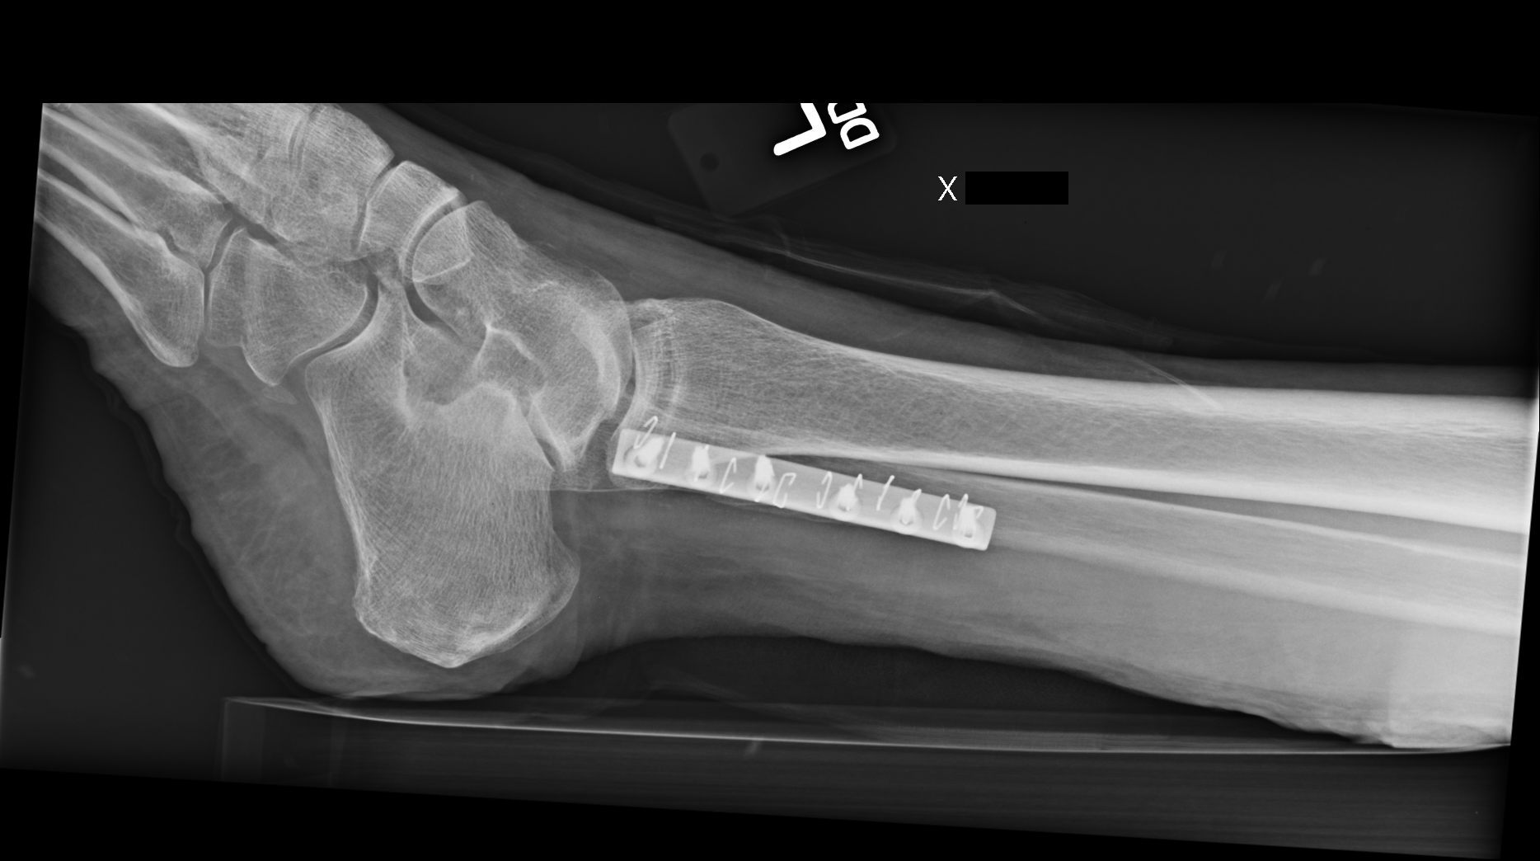

[2 of 2 positions shown; findings below may reference images not displayed]

FINDINGS: Plate and screw bridges the distal fibular diaphysis fracture seen
on previous MRI. Bone alignment is normal. No new osseous finding.
IMPRESSION: Distal fibula fracture ORIF.  No adverse findings.

## 2017-10-20 ENCOUNTER — Other Ambulatory Visit: Payer: Self-pay | Admitting: Physician Assistant

## 2017-10-20 DIAGNOSIS — Z1231 Encounter for screening mammogram for malignant neoplasm of breast: Secondary | ICD-10-CM

## 2017-11-10 ENCOUNTER — Ambulatory Visit
Admission: RE | Admit: 2017-11-10 | Discharge: 2017-11-10 | Disposition: A | Discharge status: Home / Self Care | Payer: Medicare Other | Source: Ambulatory Visit | Attending: Physician Assistant | Admitting: Physician Assistant

## 2017-11-10 DIAGNOSIS — Z1231 Encounter for screening mammogram for malignant neoplasm of breast: Secondary | ICD-10-CM | POA: Insufficient documentation

## 2018-12-20 ENCOUNTER — Other Ambulatory Visit: Payer: Self-pay | Admitting: Physician Assistant

## 2018-12-20 DIAGNOSIS — Z1231 Encounter for screening mammogram for malignant neoplasm of breast: Secondary | ICD-10-CM

## 2019-01-31 ENCOUNTER — Ambulatory Visit
Admission: RE | Admit: 2019-01-31 | Discharge: 2019-01-31 | Disposition: A | Discharge status: Home / Self Care | Payer: Medicare Other | Source: Ambulatory Visit | Attending: Physician Assistant | Admitting: Physician Assistant

## 2019-01-31 DIAGNOSIS — Z1231 Encounter for screening mammogram for malignant neoplasm of breast: Secondary | ICD-10-CM | POA: Diagnosis not present

## 2019-09-06 ENCOUNTER — Encounter: Payer: Self-pay | Admitting: Ophthalmology

## 2019-09-06 ENCOUNTER — Other Ambulatory Visit: Payer: Self-pay

## 2019-09-09 ENCOUNTER — Other Ambulatory Visit: Payer: Self-pay

## 2019-09-09 ENCOUNTER — Other Ambulatory Visit
Admission: RE | Admit: 2019-09-09 | Discharge: 2019-09-09 | Disposition: A | Discharge status: Home / Self Care | Payer: Medicare Other | Source: Ambulatory Visit | Attending: Ophthalmology | Admitting: Ophthalmology

## 2019-09-09 DIAGNOSIS — Z01812 Encounter for preprocedural laboratory examination: Secondary | ICD-10-CM | POA: Diagnosis present

## 2019-09-09 DIAGNOSIS — Z20822 Contact with and (suspected) exposure to covid-19: Secondary | ICD-10-CM | POA: Diagnosis not present

## 2019-09-09 LAB — SARS CORONAVIRUS 2 (TAT 6-24 HRS): SARS Coronavirus 2: NEGATIVE

## 2019-09-09 NOTE — Discharge Instructions (Signed)

## 2019-09-13 ENCOUNTER — Other Ambulatory Visit: Payer: Self-pay

## 2019-09-13 ENCOUNTER — Ambulatory Visit
Admission: RE | Admit: 2019-09-13 | Discharge: 2019-09-13 | Disposition: A | Discharge status: Home / Self Care | Payer: Medicare Other | Attending: Ophthalmology | Admitting: Ophthalmology

## 2019-09-13 ENCOUNTER — Encounter
Admission: RE | Disposition: A | Discharge status: Home / Self Care | Payer: Self-pay | Source: Home / Self Care | Attending: Ophthalmology

## 2019-09-13 ENCOUNTER — Encounter: Payer: Self-pay | Admitting: Ophthalmology

## 2019-09-13 ENCOUNTER — Ambulatory Visit: Payer: Medicare Other | Admitting: Anesthesiology

## 2019-09-13 DIAGNOSIS — H409 Unspecified glaucoma: Secondary | ICD-10-CM | POA: Diagnosis not present

## 2019-09-13 DIAGNOSIS — Z79899 Other long term (current) drug therapy: Secondary | ICD-10-CM | POA: Insufficient documentation

## 2019-09-13 DIAGNOSIS — H2511 Age-related nuclear cataract, right eye: Secondary | ICD-10-CM | POA: Diagnosis not present

## 2019-09-13 HISTORY — DX: Presence of dental prosthetic device (complete) (partial): Z97.2

## 2019-09-13 HISTORY — PX: CATARACT EXTRACTION W/PHACO: SHX586

## 2019-09-13 SURGERY — PHACOEMULSIFICATION, CATARACT, WITH IOL INSERTION
Anesthesia: Monitor Anesthesia Care | Site: Eye | Laterality: Right

## 2019-09-13 MED ORDER — MOXIFLOXACIN HCL 0.5 % OP SOLN
OPHTHALMIC | Status: DC | PRN
  Administered 2019-09-13: 0.2 mL via OPHTHALMIC

## 2019-09-13 MED ORDER — ARMC OPHTHALMIC DILATING DROPS
1.0000 "application " | OPHTHALMIC | Status: DC | PRN
  Administered 2019-09-13 (×3): 1 via OPHTHALMIC

## 2019-09-13 MED ORDER — OXYCODONE HCL 5 MG PO TABS: 5.0000 mg | ORAL_TABLET | Freq: Once | ORAL | Status: DC | PRN

## 2019-09-13 MED ORDER — FENTANYL CITRATE (PF) 100 MCG/2ML IJ SOLN
INTRAMUSCULAR | Status: DC | PRN
  Administered 2019-09-13 (×2): 50 ug via INTRAVENOUS

## 2019-09-13 MED ORDER — TETRACAINE HCL 0.5 % OP SOLN
1.0000 [drp] | OPHTHALMIC | Status: DC | PRN
  Administered 2019-09-13 (×3): 1 [drp] via OPHTHALMIC

## 2019-09-13 MED ORDER — OXYCODONE HCL 5 MG/5ML PO SOLN: 5.0000 mg | Freq: Once | ORAL | Status: DC | PRN

## 2019-09-13 MED ORDER — ONDANSETRON HCL 4 MG/2ML IJ SOLN
4.0000 mg | Freq: Once | INTRAMUSCULAR | Status: AC | PRN
  Administered 2019-09-13: 4 mg via INTRAMUSCULAR

## 2019-09-13 MED ORDER — BRIMONIDINE TARTRATE-TIMOLOL 0.2-0.5 % OP SOLN
OPHTHALMIC | Status: DC | PRN
  Administered 2019-09-13: 1 [drp] via OPHTHALMIC

## 2019-09-13 MED ORDER — EPINEPHRINE PF 1 MG/ML IJ SOLN
INTRAOCULAR | Status: DC | PRN
  Administered 2019-09-13: 10:00:00 55 mL via OPHTHALMIC

## 2019-09-13 MED ORDER — NA CHONDROIT SULF-NA HYALURON 40-17 MG/ML IO SOLN
INTRAOCULAR | Status: DC | PRN
  Administered 2019-09-13: 1 mL via INTRAOCULAR

## 2019-09-13 MED ORDER — LIDOCAINE HCL (PF) 2 % IJ SOLN
INTRAOCULAR | Status: DC | PRN
  Administered 2019-09-13: 1 mL

## 2019-09-13 SURGICAL SUPPLY — 21 items
CANNULA ANT/CHMB 27G (MISCELLANEOUS) ×2 IMPLANT
CANNULA ANT/CHMB 27GA (MISCELLANEOUS) ×6 IMPLANT
DISSECTOR HYDRO NUCLEUS 50X22 (MISCELLANEOUS) ×3 IMPLANT
GLOVE SURG LX 8.0 MICRO (GLOVE) ×2
GLOVE SURG LX STRL 8.0 MICRO (GLOVE) ×1 IMPLANT
GLOVE SURG TRIUMPH 8.0 PF LTX (GLOVE) ×3 IMPLANT
GOWN STRL REUS W/ TWL LRG LVL3 (GOWN DISPOSABLE) ×2 IMPLANT
GOWN STRL REUS W/TWL LRG LVL3 (GOWN DISPOSABLE) ×4
LENS IOL DIOP 20.5 (Intraocular Lens) ×3 IMPLANT
LENS IOL TECNIS MONO 20.5 (Intraocular Lens) IMPLANT
MARKER SKIN DUAL TIP RULER LAB (MISCELLANEOUS) ×3 IMPLANT
NDL FILTER BLUNT 18X1 1/2 (NEEDLE) ×1 IMPLANT
NEEDLE FILTER BLUNT 18X 1/2SAF (NEEDLE) ×2
NEEDLE FILTER BLUNT 18X1 1/2 (NEEDLE) ×1 IMPLANT
PACK EYE AFTER SURG (MISCELLANEOUS) ×3 IMPLANT
PACK OPTHALMIC (MISCELLANEOUS) ×3 IMPLANT
PACK PORFILIO (MISCELLANEOUS) ×3 IMPLANT
SYR 3ML LL SCALE MARK (SYRINGE) ×3 IMPLANT
SYR TB 1ML LUER SLIP (SYRINGE) ×3 IMPLANT
WATER STERILE IRR 250ML POUR (IV SOLUTION) ×3 IMPLANT
WIPE NON LINTING 3.25X3.25 (MISCELLANEOUS) ×3 IMPLANT

## 2019-09-13 NOTE — Transfer of Care (Signed)
Immediate Anesthesia Transfer of Care Note  Patient: Tiffany Stafford  Procedure(s) Performed: CATARACT EXTRACTION PHACO AND INTRAOCULAR LENS PLACEMENT (IOC) RIGHT 5.21  00:39.3 (Right Eye)  Patient Location: PACU  Anesthesia Type: MAC  Level of Consciousness: awake, alert  and patient cooperative  Airway and Oxygen Therapy: Patient Spontanous Breathing  Post-op Assessment: Post-op Vital signs reviewed, Patient's Cardiovascular Status Stable, Respiratory Function Stable, Patent Airway and No signs of Nausea or vomiting  Post-op Vital Signs: Reviewed and stable  Complications: No apparent anesthesia complications

## 2019-09-13 NOTE — H&P (Signed)
All labs reviewed. Abnormal studies sent to patients PCP when indicated.  Previous H&P reviewed, patient examined, there are NO CHANGES.  Tiffany Fenter Porfilio5/4/20219:24 AM

## 2019-09-13 NOTE — Anesthesia Procedure Notes (Signed)
Procedure Name: MAC Date/Time: 09/13/2019 9:38 AM Performed by: Vanetta Shawl, CRNA Pre-anesthesia Checklist: Patient identified, Emergency Drugs available, Suction available, Timeout performed and Patient being monitored Patient Re-evaluated:Patient Re-evaluated prior to induction Oxygen Delivery Method: Nasal cannula Placement Confirmation: positive ETCO2

## 2019-09-13 NOTE — Anesthesia Postprocedure Evaluation (Signed)
Anesthesia Post Note  Patient: Tiffany Stafford  Procedure(s) Performed: CATARACT EXTRACTION PHACO AND INTRAOCULAR LENS PLACEMENT (IOC) RIGHT 5.21  00:39.3 (Right Eye)     Patient location during evaluation: PACU Anesthesia Type: MAC Level of consciousness: awake and alert Pain management: pain level controlled Vital Signs Assessment: post-procedure vital signs reviewed and stable Respiratory status: spontaneous breathing, nonlabored ventilation, respiratory function stable and patient connected to nasal cannula oxygen Cardiovascular status: stable and blood pressure returned to baseline Postop Assessment: no apparent nausea or vomiting Anesthetic complications: no    Andersson Larrabee

## 2019-09-13 NOTE — Op Note (Signed)
PREOPERATIVE DIAGNOSIS:  Nuclear sclerotic cataract of the right eye.   POSTOPERATIVE DIAGNOSIS:  H25.11 Cataract   OPERATIVE PROCEDURE:@   SURGEON:  Birder Robson, MD.   ANESTHESIA:  Anesthesiologist: Fidel Levy, MD CRNA: Vanetta Shawl, CRNA  1.      Managed anesthesia care. 2.      0.55ml of Shugarcaine was instilled in the eye following the paracentesis.   COMPLICATIONS:  None.   TECHNIQUE:   Stop and chop   DESCRIPTION OF PROCEDURE:  The patient was examined and consented in the preoperative holding area where the aforementioned topical anesthesia was applied to the right eye and then brought back to the Operating Room where the right eye was prepped and draped in the usual sterile ophthalmic fashion and a lid speculum was placed. A paracentesis was created with the side port blade and the anterior chamber was filled with viscoelastic. A near clear corneal incision was performed with the steel keratome. A continuous curvilinear capsulorrhexis was performed with a cystotome followed by the capsulorrhexis forceps. Hydrodissection and hydrodelineation were carried out with BSS on a blunt cannula. The lens was removed in a stop and chop  technique and the remaining cortical material was removed with the irrigation-aspiration handpiece. The capsular bag was inflated with viscoelastic and the Technis [id number]  lens was placed in the capsular bag without complication. The remaining viscoelastic was removed from the eye with the irrigation-aspiration handpiece. The wounds were hydrated. The anterior chamber was flushed with BSS and the eye was inflated to physiologic pressure. 0.73ml of Vigamox was placed in the anterior chamber. The wounds were found to be water tight. The eye was dressed with Combigan. The patient was given protective glasses to wear throughout the day and a shield with which to sleep tonight. The patient was also given drops with which to begin a drop regimen today and will  follow-up with me in one day. Implant Name Type Inv. Item Serial No. Manufacturer Lot No. LRB No. Used Action  LENS IOL DIOP 20.5 - [id number] Intraocular Lens LENS IOL DIOP 20.5 [id number]  Right 1 Implanted   Procedure(s): CATARACT EXTRACTION PHACO AND INTRAOCULAR LENS PLACEMENT (IOC) RIGHT 5.21  00:39.3 (Right)  Electronically signed: Birder Robson 09/13/2019 9:55 AM

## 2019-09-13 NOTE — Anesthesia Preprocedure Evaluation (Signed)
Anesthesia Evaluation  Patient identified by MRN, date of birth, ID band Patient awake    Reviewed: NPO status   History of Anesthesia Complications Negative for: history of anesthetic complications  Airway Mallampati: II  TM Distance: >3 FB Neck ROM: full    Dental  (+) Upper Dentures   Pulmonary neg pulmonary ROS,    Pulmonary exam normal        Cardiovascular Exercise Tolerance: Good hypertension (no meds), Normal cardiovascular exam     Neuro/Psych Glaucoma;  Ddd;  Subdural hematoma : 2010 negative psych ROS   GI/Hepatic Neg liver ROS, GERD  Controlled,  Endo/Other  negative endocrine ROS  Renal/GU negative Renal ROS  negative genitourinary   Musculoskeletal  (+) Arthritis ,   Abdominal   Peds  Hematology negative hematology ROS (+) Colon ca: 1995    Anesthesia Other Findings  Covid: NEG.  Reproductive/Obstetrics                             Anesthesia Physical Anesthesia Plan  ASA: II  Anesthesia Plan: MAC   Post-op Pain Management:    Induction:   PONV Risk Score and Plan: 2 and TIVA and Midazolam  Airway Management Planned:   Additional Equipment:   Intra-op Plan:   Post-operative Plan:   Informed Consent: I have reviewed the patients History and Physical, chart, labs and discussed the procedure including the risks, benefits and alternatives for the proposed anesthesia with the patient or authorized representative who has indicated his/her understanding and acceptance.       Plan Discussed with: CRNA  Anesthesia Plan Comments:         Anesthesia Quick Evaluation

## 2019-09-14 ENCOUNTER — Encounter: Payer: Self-pay | Admitting: *Deleted

## 2019-09-27 ENCOUNTER — Encounter: Payer: Self-pay | Admitting: Ophthalmology

## 2019-09-27 ENCOUNTER — Other Ambulatory Visit: Payer: Self-pay

## 2019-09-30 ENCOUNTER — Other Ambulatory Visit
Admission: RE | Admit: 2019-09-30 | Discharge: 2019-09-30 | Disposition: A | Discharge status: Home / Self Care | Payer: Medicare Other | Source: Ambulatory Visit | Attending: Ophthalmology | Admitting: Ophthalmology

## 2019-09-30 DIAGNOSIS — Z20822 Contact with and (suspected) exposure to covid-19: Secondary | ICD-10-CM | POA: Diagnosis not present

## 2019-09-30 DIAGNOSIS — Z01812 Encounter for preprocedural laboratory examination: Secondary | ICD-10-CM | POA: Diagnosis present

## 2019-09-30 LAB — SARS CORONAVIRUS 2 (TAT 6-24 HRS): SARS Coronavirus 2: NEGATIVE

## 2019-09-30 NOTE — Discharge Instructions (Signed)

## 2019-10-04 ENCOUNTER — Ambulatory Visit
Admission: RE | Admit: 2019-10-04 | Discharge: 2019-10-04 | Disposition: A | Discharge status: Home / Self Care | Payer: Medicare Other | Attending: Ophthalmology | Admitting: Ophthalmology

## 2019-10-04 ENCOUNTER — Ambulatory Visit: Payer: Medicare Other | Admitting: Anesthesiology

## 2019-10-04 ENCOUNTER — Encounter: Payer: Self-pay | Admitting: Ophthalmology

## 2019-10-04 ENCOUNTER — Other Ambulatory Visit: Payer: Self-pay

## 2019-10-04 ENCOUNTER — Encounter
Admission: RE | Disposition: A | Discharge status: Home / Self Care | Payer: Self-pay | Source: Home / Self Care | Attending: Ophthalmology

## 2019-10-04 DIAGNOSIS — I10 Essential (primary) hypertension: Secondary | ICD-10-CM | POA: Diagnosis not present

## 2019-10-04 DIAGNOSIS — M1990 Unspecified osteoarthritis, unspecified site: Secondary | ICD-10-CM | POA: Diagnosis not present

## 2019-10-04 DIAGNOSIS — E7800 Pure hypercholesterolemia, unspecified: Secondary | ICD-10-CM | POA: Diagnosis not present

## 2019-10-04 DIAGNOSIS — H409 Unspecified glaucoma: Secondary | ICD-10-CM | POA: Insufficient documentation

## 2019-10-04 DIAGNOSIS — Z882 Allergy status to sulfonamides status: Secondary | ICD-10-CM | POA: Diagnosis not present

## 2019-10-04 DIAGNOSIS — Z86718 Personal history of other venous thrombosis and embolism: Secondary | ICD-10-CM | POA: Insufficient documentation

## 2019-10-04 DIAGNOSIS — Z9049 Acquired absence of other specified parts of digestive tract: Secondary | ICD-10-CM | POA: Diagnosis not present

## 2019-10-04 DIAGNOSIS — K219 Gastro-esophageal reflux disease without esophagitis: Secondary | ICD-10-CM | POA: Diagnosis not present

## 2019-10-04 DIAGNOSIS — E785 Hyperlipidemia, unspecified: Secondary | ICD-10-CM | POA: Insufficient documentation

## 2019-10-04 DIAGNOSIS — Z79899 Other long term (current) drug therapy: Secondary | ICD-10-CM | POA: Insufficient documentation

## 2019-10-04 DIAGNOSIS — Z85038 Personal history of other malignant neoplasm of large intestine: Secondary | ICD-10-CM | POA: Insufficient documentation

## 2019-10-04 DIAGNOSIS — H2512 Age-related nuclear cataract, left eye: Secondary | ICD-10-CM | POA: Diagnosis not present

## 2019-10-04 HISTORY — PX: CATARACT EXTRACTION W/PHACO: SHX586

## 2019-10-04 SURGERY — PHACOEMULSIFICATION, CATARACT, WITH IOL INSERTION
Anesthesia: Monitor Anesthesia Care | Site: Eye | Laterality: Left

## 2019-10-04 MED ORDER — FENTANYL CITRATE (PF) 100 MCG/2ML IJ SOLN
INTRAMUSCULAR | Status: DC | PRN
  Administered 2019-10-04: 50 ug via INTRAVENOUS

## 2019-10-04 MED ORDER — BRIMONIDINE TARTRATE-TIMOLOL 0.2-0.5 % OP SOLN
OPHTHALMIC | Status: DC | PRN
  Administered 2019-10-04: 1 [drp] via OPHTHALMIC

## 2019-10-04 MED ORDER — ARMC OPHTHALMIC DILATING DROPS
1.0000 "application " | OPHTHALMIC | Status: DC | PRN
  Administered 2019-10-04 (×3): 1 via OPHTHALMIC

## 2019-10-04 MED ORDER — EPINEPHRINE PF 1 MG/ML IJ SOLN
INTRAOCULAR | Status: DC | PRN
  Administered 2019-10-04: 64 mL via OPHTHALMIC

## 2019-10-04 MED ORDER — TETRACAINE HCL 0.5 % OP SOLN
1.0000 [drp] | OPHTHALMIC | Status: DC | PRN
  Administered 2019-10-04 (×3): 1 [drp] via OPHTHALMIC

## 2019-10-04 MED ORDER — MIDAZOLAM HCL 2 MG/2ML IJ SOLN
INTRAMUSCULAR | Status: DC | PRN
  Administered 2019-10-04: 1 mg via INTRAVENOUS

## 2019-10-04 MED ORDER — ONDANSETRON HCL 4 MG/2ML IJ SOLN
INTRAMUSCULAR | Status: DC | PRN
  Administered 2019-10-04: 4 mg via INTRAVENOUS

## 2019-10-04 MED ORDER — LACTATED RINGERS IV SOLN: 100.0000 mL/h | INTRAVENOUS | Status: DC

## 2019-10-04 MED ORDER — MOXIFLOXACIN HCL 0.5 % OP SOLN
OPHTHALMIC | Status: DC | PRN
  Administered 2019-10-04: 0.2 mL via OPHTHALMIC

## 2019-10-04 MED ORDER — LIDOCAINE HCL (PF) 2 % IJ SOLN
INTRAOCULAR | Status: DC | PRN
  Administered 2019-10-04: 1 mL

## 2019-10-04 MED ORDER — NA CHONDROIT SULF-NA HYALURON 40-17 MG/ML IO SOLN
INTRAOCULAR | Status: DC | PRN
  Administered 2019-10-04: 1 mL via INTRAOCULAR

## 2019-10-04 SURGICAL SUPPLY — 20 items
CANNULA ANT/CHMB 27G (MISCELLANEOUS) ×2 IMPLANT
CANNULA ANT/CHMB 27GA (MISCELLANEOUS) ×6 IMPLANT
GLOVE SURG LX 8.0 MICRO (GLOVE) ×2
GLOVE SURG LX STRL 8.0 MICRO (GLOVE) ×1 IMPLANT
GLOVE SURG TRIUMPH 8.0 PF LTX (GLOVE) ×3 IMPLANT
GOWN STRL REUS W/ TWL LRG LVL3 (GOWN DISPOSABLE) ×2 IMPLANT
GOWN STRL REUS W/TWL LRG LVL3 (GOWN DISPOSABLE) ×4
LENS IOL DIOP 19.0 (Intraocular Lens) ×3 IMPLANT
LENS IOL TECNIS MONO 19.0 (Intraocular Lens) IMPLANT
MARKER SKIN DUAL TIP RULER LAB (MISCELLANEOUS) ×3 IMPLANT
NDL FILTER BLUNT 18X1 1/2 (NEEDLE) ×1 IMPLANT
NEEDLE FILTER BLUNT 18X 1/2SAF (NEEDLE) ×2
NEEDLE FILTER BLUNT 18X1 1/2 (NEEDLE) ×1 IMPLANT
PACK EYE AFTER SURG (MISCELLANEOUS) ×3 IMPLANT
PACK OPTHALMIC (MISCELLANEOUS) ×3 IMPLANT
PACK PORFILIO (MISCELLANEOUS) ×3 IMPLANT
SYR 3ML LL SCALE MARK (SYRINGE) ×3 IMPLANT
SYR TB 1ML LUER SLIP (SYRINGE) ×3 IMPLANT
WATER STERILE IRR 250ML POUR (IV SOLUTION) ×3 IMPLANT
WIPE NON LINTING 3.25X3.25 (MISCELLANEOUS) ×3 IMPLANT

## 2019-10-04 NOTE — H&P (Signed)
All labs reviewed. Abnormal studies sent to patients PCP when indicated.  Previous H&P reviewed, patient examined, there are NO CHANGES.  Tiffany Botelho Porfilio5/25/202111:18 AM

## 2019-10-04 NOTE — Anesthesia Procedure Notes (Signed)
Procedure Name: MAC Performed by: Estuardo Frisbee, CRNA Pre-anesthesia Checklist: Patient identified, Emergency Drugs available, Suction available, Timeout performed and Patient being monitored Patient Re-evaluated:Patient Re-evaluated prior to induction Oxygen Delivery Method: Nasal cannula Placement Confirmation: positive ETCO2       

## 2019-10-04 NOTE — Anesthesia Postprocedure Evaluation (Signed)
Anesthesia Post Note  Patient: Tiffany Stafford  Procedure(s) Performed: CATARACT EXTRACTION PHACO AND INTRAOCULAR LENS PLACEMENT (IOC) LEFT 6.80  00:39.3 (Left Eye)     Patient location during evaluation: PACU Anesthesia Type: MAC Level of consciousness: awake and alert Pain management: pain level controlled Vital Signs Assessment: post-procedure vital signs reviewed and stable Respiratory status: spontaneous breathing, nonlabored ventilation, respiratory function stable and patient connected to nasal cannula oxygen Cardiovascular status: stable and blood pressure returned to baseline Postop Assessment: no apparent nausea or vomiting Anesthetic complications: no    Zaden Sako A  Verlyn Lambert

## 2019-10-04 NOTE — Op Note (Signed)
PREOPERATIVE DIAGNOSIS:  Nuclear sclerotic cataract of the left eye.   POSTOPERATIVE DIAGNOSIS:  Nuclear sclerotic cataract of the left eye.   OPERATIVE PROCEDURE:@   SURGEON:  Birder Robson, MD.   ANESTHESIA:  Anesthesiologist: Heniser, Fredric Dine, MD CRNA: Mayme Genta, CRNA  1.      Managed anesthesia care. 2.     0.41ml of Shugarcaine was instilled following the paracentesis   COMPLICATIONS:  None.   TECHNIQUE:   Stop and chop   DESCRIPTION OF PROCEDURE:  The patient was examined and consented in the preoperative holding area where the aforementioned topical anesthesia was applied to the left eye and then brought back to the Operating Room where the left eye was prepped and draped in the usual sterile ophthalmic fashion and a lid speculum was placed. A paracentesis was created with the side port blade and the anterior chamber was filled with viscoelastic. A near clear corneal incision was performed with the steel keratome. A continuous curvilinear capsulorrhexis was performed with a cystotome followed by the capsulorrhexis forceps. Hydrodissection and hydrodelineation were carried out with BSS on a blunt cannula. The lens was removed in a stop and chop  technique and the remaining cortical material was removed with the irrigation-aspiration handpiece. The capsular bag was inflated with viscoelastic and the Technis [id number] lens was placed in the capsular bag without complication. The remaining viscoelastic was removed from the eye with the irrigation-aspiration handpiece. The wounds were hydrated. The anterior chamber was flushed with BSS and the eye was inflated to physiologic pressure. 0.68ml Vigamox was placed in the anterior chamber. The wounds were found to be water tight. The eye was dressed with Combigan. The patient was given protective glasses to wear throughout the day and a shield with which to sleep tonight. The patient was also given drops with which to begin a drop regimen today  and will follow-up with me in one day. Implant Name Type Inv. Item Serial No. Manufacturer Lot No. LRB No. Used Action  LENS IOL DIOP 19.0 - [id number] Intraocular Lens LENS IOL DIOP 19.0 [id number]  Left 1 Implanted    Procedure(s): CATARACT EXTRACTION PHACO AND INTRAOCULAR LENS PLACEMENT (IOC) LEFT 6.80  00:39.3 (Left)  Electronically signed: Birder Robson 10/04/2019 11:44 AM

## 2019-10-04 NOTE — Anesthesia Preprocedure Evaluation (Signed)
Anesthesia Evaluation  Patient identified by MRN, date of birth, ID band Patient awake    Reviewed: Allergy & Precautions, NPO status , Patient's Chart, lab work & pertinent test results, reviewed documented beta blocker date and time   History of Anesthesia Complications (+) PONV and history of anesthetic complications  Airway Mallampati: II  TM Distance: >3 FB Neck ROM: Limited    Dental  (+) Upper Dentures   Pulmonary    Pulmonary exam normal breath sounds clear to auscultation       Cardiovascular Exercise Tolerance: Good hypertension, (-) angina(-) DOE Normal cardiovascular exam Rhythm:Regular Rate:Normal   HLD   Neuro/Psych Glaucoma;  Ddd;  Subdural hematoma : 2010    GI/Hepatic GERD  Controlled, Benign esophageal stricture   Endo/Other    Renal/GU      Musculoskeletal  (+) Arthritis ,   Abdominal   Peds  Hematology Colon ca: 1995    Anesthesia Other Findings Colon cancer  Reproductive/Obstetrics                            Anesthesia Physical  Anesthesia Plan  ASA: II  Anesthesia Plan: MAC   Post-op Pain Management:    Induction: Intravenous  PONV Risk Score and Plan: 3 and TIVA, Midazolam, Treatment may vary due to age or medical condition and Ondansetron  Airway Management Planned: Nasal Cannula  Additional Equipment:   Intra-op Plan:   Post-operative Plan:   Informed Consent: I have reviewed the patients History and Physical, chart, labs and discussed the procedure including the risks, benefits and alternatives for the proposed anesthesia with the patient or authorized representative who has indicated his/her understanding and acceptance.       Plan Discussed with: CRNA  Anesthesia Plan Comments:         Anesthesia Quick Evaluation

## 2019-10-04 NOTE — Transfer of Care (Signed)
Immediate Anesthesia Transfer of Care Note  Patient: Tiffany Stafford  Procedure(s) Performed: CATARACT EXTRACTION PHACO AND INTRAOCULAR LENS PLACEMENT (IOC) LEFT 6.80  00:39.3 (Left Eye)  Patient Location: PACU  Anesthesia Type: MAC  Level of Consciousness: awake, alert  and patient cooperative  Airway and Oxygen Therapy: Patient Spontanous Breathing and Patient connected to supplemental oxygen  Post-op Assessment: Post-op Vital signs reviewed, Patient's Cardiovascular Status Stable, Respiratory Function Stable, Patent Airway and No signs of Nausea or vomiting  Post-op Vital Signs: Reviewed and stable  Complications: No apparent anesthesia complications

## 2019-10-05 ENCOUNTER — Encounter: Payer: Self-pay | Admitting: *Deleted

## 2020-10-04 ENCOUNTER — Other Ambulatory Visit: Payer: Self-pay

## 2020-10-04 ENCOUNTER — Encounter: Payer: Self-pay | Admitting: Emergency Medicine

## 2020-10-04 ENCOUNTER — Ambulatory Visit
Admission: EM | Admit: 2020-10-04 | Discharge: 2020-10-04 | Disposition: A | Discharge status: Home / Self Care | Payer: Medicare Other | Attending: Family Medicine | Admitting: Family Medicine

## 2020-10-04 DIAGNOSIS — U071 COVID-19: Secondary | ICD-10-CM | POA: Diagnosis not present

## 2020-10-04 DIAGNOSIS — R42 Dizziness and giddiness: Secondary | ICD-10-CM | POA: Diagnosis present

## 2020-10-04 DIAGNOSIS — I10 Essential (primary) hypertension: Secondary | ICD-10-CM | POA: Diagnosis not present

## 2020-10-04 DIAGNOSIS — B349 Viral infection, unspecified: Secondary | ICD-10-CM

## 2020-10-04 DIAGNOSIS — Z882 Allergy status to sulfonamides status: Secondary | ICD-10-CM | POA: Diagnosis not present

## 2020-10-04 LAB — COMPREHENSIVE METABOLIC PANEL
ALT: 26 U/L (ref 0–44)
AST: 29 U/L (ref 15–41)
Albumin: 3.8 g/dL (ref 3.5–5.0)
Alkaline Phosphatase: 77 U/L (ref 38–126)
Anion gap: 9 (ref 5–15)
BUN: 14 mg/dL (ref 8–23)
CO2: 24 mmol/L (ref 22–32)
Calcium: 8.7 mg/dL — ABNORMAL LOW (ref 8.9–10.3)
Chloride: 101 mmol/L (ref 98–111)
Creatinine, Ser: 0.76 mg/dL (ref 0.44–1.00)
GFR, Estimated: >60 mL/min (ref >60)
Glucose, Bld: 113 mg/dL — ABNORMAL HIGH (ref 70–99)
Potassium: 3.6 mmol/L (ref 3.5–5.1)
Sodium: 134 mmol/L — ABNORMAL LOW (ref 135–145)
Total Bilirubin: 0.9 mg/dL (ref 0.3–1.2)
Total Protein: 7.6 g/dL (ref 6.5–8.1)

## 2020-10-04 LAB — CBC WITH DIFFERENTIAL/PLATELET
Abs Immature Granulocytes: 0 10*3/uL (ref 0.00–0.07)
Basophils Absolute: 0 10*3/uL (ref 0.0–0.1)
Basophils Relative: 1 %
Eosinophils Absolute: 0 10*3/uL (ref 0.0–0.5)
Eosinophils Relative: 0 %
HCT: 40.3 % (ref 36.0–46.0)
Hemoglobin: 14.4 g/dL (ref 12.0–15.0)
Immature Granulocytes: 0 %
Lymphocytes Relative: 60 %
Lymphs Abs: 2.2 10*3/uL (ref 0.7–4.0)
MCH: 33 pg (ref 26.0–34.0)
MCHC: 35.7 g/dL (ref 30.0–36.0)
MCV: 92.4 fL (ref 80.0–100.0)
Monocytes Absolute: 0.3 10*3/uL (ref 0.1–1.0)
Monocytes Relative: 7 %
Neutro Abs: 1.2 10*3/uL — ABNORMAL LOW (ref 1.7–7.7)
Neutrophils Relative %: 32 %
Platelets: 176 10*3/uL (ref 150–400)
RBC: 4.36 MIL/uL (ref 3.87–5.11)
RDW: 12.9 % (ref 11.5–15.5)
WBC: 3.7 10*3/uL — ABNORMAL LOW (ref 4.0–10.5)
nRBC: 0 % (ref 0.0–0.2)

## 2020-10-04 LAB — SARS CORONAVIRUS 2 (TAT 6-24 HRS): SARS Coronavirus 2: POSITIVE — AB

## 2020-10-04 MED ORDER — AMLODIPINE BESYLATE 5 MG PO TABS: 5.0000 mg | ORAL_TABLET | Freq: Every day | ORAL | 1 refills | Status: DC

## 2020-10-04 NOTE — ED Triage Notes (Addendum)
Patient c/o dizziness, weakness, and cough that started last Friday. Patient states she has episodes of vertigo.

## 2020-10-04 NOTE — ED Provider Notes (Signed)
[facility]    CSN: [id number] Arrival date & time: 10/04/20  0825      History   Chief Complaint Chief Complaint  Patient presents with  . Dizziness  . Weakness  . Cough   HPI  80 year old female presents with multiple complaints.  Patient has not been feeling well since Friday.  She reports that she initially had sore throat, congestion, headache and cough.  The symptoms have improved.  She is now bothered by fatigue and dizziness.  She reports generalized weakness.  She notes good appetite.  No relieving factors.  She has a history of hypertension but is not on any pharmacotherapy at this time.  Her blood pressure is markedly elevated today.  No other complaints.  Past Medical History:  Diagnosis Date  . Benign esophageal stricture   . Cancer [facility])    colon cancer  . DDD (degenerative disc disease), cervical   . GERD (gastroesophageal reflux disease)   . Glaucoma   . Hyperlipidemia   . Hypertension    Currently OK without meds  . Neuropathy   . PONV (postoperative nausea and vomiting)    Nausea after 1st cataract surgery  . Subdural hematoma (HCC) 2/10  . Wears dentures    full upper    There are no problems to display for this patient.   Past Surgical History:  Procedure Laterality Date  . ABDOMINAL HYSTERECTOMY    . APPENDECTOMY    . CATARACT EXTRACTION W/PHACO Right 09/13/2019   Procedure: CATARACT EXTRACTION PHACO AND INTRAOCULAR LENS PLACEMENT (IOC) RIGHT 5.21  00:39.3;  Surgeon: Birder Robson, MD;  Location: [facility];  Service: Ophthalmology;  Laterality: Right;  . CATARACT EXTRACTION W/PHACO Left 10/04/2019   Procedure: CATARACT EXTRACTION PHACO AND INTRAOCULAR LENS PLACEMENT (IOC) LEFT 6.80  00:39.3;  Surgeon: Birder Robson, MD;  Location: [facility];  Service: Ophthalmology;  Laterality: Left;  . CHOLECYSTECTOMY    . COLON SURGERY    . COLONOSCOPY WITH PROPOFOL N/A 09/17/2015   Procedure: COLONOSCOPY WITH PROPOFOL;   Surgeon: Hulen Luster, MD;  Location: [facility] ENDOSCOPY;  Service: Gastroenterology;  Laterality: N/A;  . ESOPHAGOGASTRODUODENOSCOPY (EGD) WITH PROPOFOL N/A 09/17/2015   Procedure: ESOPHAGOGASTRODUODENOSCOPY (EGD) WITH PROPOFOL;  Surgeon: Hulen Luster, MD;  Location: [facility] ENDOSCOPY;  Service: Gastroenterology;  Laterality: N/A;  . ORIF ANKLE FRACTURE Left 04/17/2015   Procedure: OPEN REDUCTION INTERNAL FIXATION (ORIF) ANKLE FRACTURE;  Surgeon: Hessie Knows, MD;  Location: [facility]S;  Service: Orthopedics;  Laterality: Left;    OB History   No obstetric history on file.      Home Medications    Prior to Admission medications   Medication Sig Start Date End Date Taking? Authorizing Provider  amLODipine (NORVASC) 5 MG tablet Take 1 tablet (5 mg total) by mouth daily. 10/04/20  Yes Coral Spikes, DO    Family History History reviewed. No pertinent family history.  Social History Social History   Tobacco Use  . Smoking status: Never Smoker  . Smokeless tobacco: Never Used  Vaping Use  . Vaping Use: Never used  Substance Use Topics  . Alcohol use: No  . Drug use: No     Allergies   Sulfa antibiotics   Review of Systems Review of Systems Per HPI  Physical Exam Triage Vital Signs ED Triage Vitals  Enc Vitals Group     BP 10/04/20 0902 (!) 184/84     Pulse Rate 10/04/20 0902 (!) 57     Resp 10/04/20  0902 18     Temp 10/04/20 0902 97.9 F (36.6 C)     Temp Source 10/04/20 0902 Oral     SpO2 10/04/20 0902 99 %     Weight 10/04/20 0901 130 lb 15.3 oz (59.4 kg)     Height 10/04/20 0901 5\' 4"  (1.626 m)     Head Circumference --      Peak Flow --      Pain Score 10/04/20 0900 0     Pain Loc --      Pain Edu? --    Updated Vital Signs BP (!) 160/109 (BP Location: Left Arm)   Pulse (!) 57   Temp 97.9 F (36.6 C) (Oral)   Resp 18   Ht 5\' 4"  (1.626 m)   Wt 59.4 kg   SpO2 99%   BMI 22.48 kg/m   Visual Acuity Right Eye Distance:   Left Eye Distance:   Bilateral Distance:     Right Eye Near:   Left Eye Near:    Bilateral Near:     Physical Exam Vitals and nursing note reviewed.  Constitutional:      General: She is not in acute distress.    Appearance: Normal appearance. She is not ill-appearing.  HENT:     Head: Normocephalic and atraumatic.     Right Ear: Tympanic membrane normal.     Left Ear: Tympanic membrane normal.     Mouth/Throat:     Pharynx: Oropharynx is clear. No oropharyngeal exudate or posterior oropharyngeal erythema.  Cardiovascular:     Rate and Rhythm: Regular rhythm. Bradycardia present.  Pulmonary:     Effort: Pulmonary effort is normal.     Breath sounds: Normal breath sounds. No wheezing, rhonchi or rales.  Neurological:     Mental Status: She is alert.  Psychiatric:        Mood and Affect: Mood normal.        Behavior: Behavior normal.    UC Treatments / Results  Labs (all labs ordered are listed, but only abnormal results are displayed) Labs Reviewed  CBC WITH DIFFERENTIAL/PLATELET - Abnormal; Notable for the following components:      Result Value   WBC 3.7 (*)    Neutro Abs 1.2 (*)    All other components within normal limits  COMPREHENSIVE METABOLIC PANEL - Abnormal; Notable for the following components:   Sodium 134 (*)    Glucose, Bld 113 (*)    Calcium 8.7 (*)    All other components within normal limits  SARS CORONAVIRUS 2 (TAT 6-24 HRS)    EKG   Radiology No results found.  Procedures Procedures (including critical care time)  Medications Ordered in UC Medications - No data to display  Initial Impression / Assessment and Plan / UC Course  I have reviewed the triage vital signs and the nursing notes.  Pertinent labs & imaging results that were available during my care of the patient were reviewed by me and considered in my medical decision making (see chart for details).    80 year old female presents with suspected recent viral illness.  Patient also quite hypertensive.  Her hypertension is  uncontrolled.  She has not been on pharmacotherapy in quite some time.  Her laboratory studies are essentially unremarkable.  She did have a mild decreased white count.  Awaiting COVID test results.  Supportive care.  Lots of fluids.  Regarding her uncontrolled and worsening hypertension, I have started her on Norvasc.  Recommended follow-up with her  primary care physician in the next 1 to 2 weeks.  Final Clinical Impressions(s) / UC Diagnoses   Final diagnoses:  Viral illness  Uncontrolled hypertension     Discharge Instructions     Rest. Lots of fluids.  Start the blood pressure medication.  Please see if you can follow up with your doctor in the next week.  Take care  Dr. Lacinda Axon    ED Prescriptions    Medication Sig Dispense Auth. Provider   amLODipine (NORVASC) 5 MG tablet Take 1 tablet (5 mg total) by mouth daily. 90 tablet Thersa Salt G, DO     PDMP not reviewed this encounter.   Coral Spikes, DO 10/04/20 1021

## 2020-10-04 NOTE — Discharge Instructions (Signed)
Rest. Lots of fluids.  Start the blood pressure medication.  Please see if you can follow up with your doctor in the next week.  Take care  Dr. Lacinda Axon

## 2021-04-18 ENCOUNTER — Other Ambulatory Visit: Payer: Self-pay | Admitting: Physician Assistant

## 2021-04-18 DIAGNOSIS — Z1231 Encounter for screening mammogram for malignant neoplasm of breast: Secondary | ICD-10-CM

## 2021-05-09 ENCOUNTER — Ambulatory Visit
Admission: RE | Admit: 2021-05-09 | Discharge: 2021-05-09 | Disposition: A | Discharge status: Home / Self Care | Payer: Medicare Other | Source: Ambulatory Visit | Attending: Physician Assistant | Admitting: Physician Assistant

## 2021-05-09 ENCOUNTER — Other Ambulatory Visit: Payer: Self-pay

## 2021-05-09 DIAGNOSIS — Z1231 Encounter for screening mammogram for malignant neoplasm of breast: Secondary | ICD-10-CM | POA: Insufficient documentation

## 2021-09-03 ENCOUNTER — Other Ambulatory Visit: Payer: Self-pay | Admitting: Physician Assistant

## 2021-09-03 ENCOUNTER — Ambulatory Visit
Admission: RE | Admit: 2021-09-03 | Discharge: 2021-09-03 | Disposition: A | Discharge status: Home / Self Care | Payer: Medicare Other | Source: Ambulatory Visit | Attending: Physician Assistant | Admitting: Physician Assistant

## 2021-09-03 ENCOUNTER — Other Ambulatory Visit (HOSPITAL_COMMUNITY): Payer: Self-pay | Admitting: Physician Assistant

## 2021-09-03 DIAGNOSIS — S0990XA Unspecified injury of head, initial encounter: Secondary | ICD-10-CM

## 2021-09-03 DIAGNOSIS — R42 Dizziness and giddiness: Secondary | ICD-10-CM | POA: Diagnosis present

## 2021-09-10 ENCOUNTER — Emergency Department: Payer: Medicare Other

## 2021-09-10 ENCOUNTER — Other Ambulatory Visit: Payer: Self-pay

## 2021-09-10 ENCOUNTER — Emergency Department
Admission: EM | Admit: 2021-09-10 | Discharge: 2021-09-10 | Disposition: A | Discharge status: Home / Self Care | Payer: Medicare Other | Attending: Emergency Medicine | Admitting: Emergency Medicine

## 2021-09-10 DIAGNOSIS — R1310 Dysphagia, unspecified: Secondary | ICD-10-CM | POA: Diagnosis not present

## 2021-09-10 DIAGNOSIS — R7989 Other specified abnormal findings of blood chemistry: Secondary | ICD-10-CM | POA: Insufficient documentation

## 2021-09-10 DIAGNOSIS — R112 Nausea with vomiting, unspecified: Secondary | ICD-10-CM | POA: Insufficient documentation

## 2021-09-10 DIAGNOSIS — R42 Dizziness and giddiness: Secondary | ICD-10-CM | POA: Diagnosis not present

## 2021-09-10 LAB — COMPREHENSIVE METABOLIC PANEL
ALT: 18 U/L (ref 0–44)
AST: 24 U/L (ref 15–41)
Albumin: 3.7 g/dL (ref 3.5–5.0)
Alkaline Phosphatase: 62 U/L (ref 38–126)
Anion gap: 8 (ref 5–15)
BUN: 24 mg/dL — ABNORMAL HIGH (ref 8–23)
CO2: 21 mmol/L — ABNORMAL LOW (ref 22–32)
Calcium: 9.1 mg/dL (ref 8.9–10.3)
Chloride: 113 mmol/L — ABNORMAL HIGH (ref 98–111)
Creatinine, Ser: 0.76 mg/dL (ref 0.44–1.00)
GFR, Estimated: >60 mL/min (ref >60)
Glucose, Bld: 118 mg/dL — ABNORMAL HIGH (ref 70–99)
Potassium: 3.5 mmol/L (ref 3.5–5.1)
Sodium: 142 mmol/L (ref 135–145)
Total Bilirubin: 0.9 mg/dL (ref 0.3–1.2)
Total Protein: 7.6 g/dL (ref 6.5–8.1)

## 2021-09-10 LAB — URINALYSIS, ROUTINE W REFLEX MICROSCOPIC
Bilirubin Urine: NEGATIVE
Glucose, UA: NEGATIVE mg/dL
Hgb urine dipstick: NEGATIVE
Ketones, ur: NEGATIVE mg/dL
Leukocytes,Ua: NEGATIVE
Nitrite: NEGATIVE
Protein, ur: 30 mg/dL — AB
Specific Gravity, Urine: 1.03 (ref 1.005–1.030)
pH: 5 (ref 5.0–8.0)

## 2021-09-10 LAB — TROPONIN I (HIGH SENSITIVITY)
Troponin I (High Sensitivity): 6 ng/L (ref <18)
Troponin I (High Sensitivity): 6 ng/L (ref <18)

## 2021-09-10 LAB — CBC WITH DIFFERENTIAL/PLATELET
Abs Immature Granulocytes: 0.01 10*3/uL (ref 0.00–0.07)
Basophils Absolute: 0 10*3/uL (ref 0.0–0.1)
Basophils Relative: 1 %
Eosinophils Absolute: 0 10*3/uL (ref 0.0–0.5)
Eosinophils Relative: 1 %
HCT: 42 % (ref 36.0–46.0)
Hemoglobin: 14 g/dL (ref 12.0–15.0)
Immature Granulocytes: 0 %
Lymphocytes Relative: 42 %
Lymphs Abs: 1.9 10*3/uL (ref 0.7–4.0)
MCH: 32.4 pg (ref 26.0–34.0)
MCHC: 33.3 g/dL (ref 30.0–36.0)
MCV: 97.2 fL (ref 80.0–100.0)
Monocytes Absolute: 0.3 10*3/uL (ref 0.1–1.0)
Monocytes Relative: 6 %
Neutro Abs: 2.3 10*3/uL (ref 1.7–7.7)
Neutrophils Relative %: 50 %
Platelets: 178 10*3/uL (ref 150–400)
RBC: 4.32 MIL/uL (ref 3.87–5.11)
RDW: 13.2 % (ref 11.5–15.5)
WBC: 4.5 10*3/uL (ref 4.0–10.5)
nRBC: 0 % (ref 0.0–0.2)

## 2021-09-10 NOTE — Discharge Instructions (Addendum)
The MRI did not show anything new going on.  Please make sure you are getting plenty of fluids to drink.  When you stand up after you have been laying sit up first and then swing your legs around and sit with the feet on the floor for a couple minutes and then stand up slowly holding onto something.  Please return if you are worse and follow-up with your regular doctor.  If you get very unsteady walking you can try a walker as well. ? ?Dr. Alice Reichert said to call the office and he will work with you to see if he can get you in sooner. ?

## 2021-09-10 NOTE — ED Notes (Signed)
ED Provider at bedside. 

## 2021-09-10 NOTE — ED Triage Notes (Signed)
Pt was sent from PCP, pt tripped and fell on 4/17 and saw here PCP on 4/25 due to continued dizziness with nausea since the fall, CT was performed but continues to have nausea with dizziness and was concerned she may need a second CT ?

## 2021-09-10 NOTE — ED Provider Notes (Signed)
? ?[facility] ?Provider Note ? ? ? Event Date/Time  ? First MD Initiated Contact with Patient 09/10/21 0902   ?  (approximate) ? ? ?History  ? ?Dizziness and Fall ? ? ?HPI ? ?Tiffany Stafford is a 81 y.o. female patient with episodes of lightheadedness and spinning.  They seem to be worse when she stands from a sitting position but occasionally happen with moving her head.  She had fallen on the 17th.  She had had the dizziness intermittently before hand seem to get worse afterwards.  Additionally her daughter who is with her reports that the patient went out to eat with friends a few days ago and since then has been having a lot of nausea and vomiting.  She had some more this morning.  Patient has had esophageal dilatation several times and is scheduled to have some in 2 weeks with Dr. Alice Reichert.  In the meantime she says solids are getting hung up in her throat although she is able to tolerate water or liquids.  She really does not like water she says possibly she is not getting enough to eat.  She is not having any diarrhea or abdominal pain. ? ?  ? ? ?Physical Exam  ? ?Triage Vital Signs: ?ED Triage Vitals  ?Enc Vitals Group  ?   BP 09/10/21 0903 (!) 172/87  ?   Pulse Rate 09/10/21 0903 78  ?   Resp 09/10/21 0903 17  ?   Temp 09/10/21 0903 98.5 ?F (36.9 ?C)  ?   Temp Source 09/10/21 0903 Oral  ?   SpO2 09/10/21 0903 100 %  ?   Weight 09/10/21 0859 120 lb (54.4 kg)  ?   Height 09/10/21 0859 '5\' 4"'$  (1.626 m)  ?   Head Circumference --   ?   Peak Flow --   ?   Pain Score 09/10/21 0859 0  ?   Pain Loc --   ?   Pain Edu? --   ?   Excl. in [place]? --   ? ? ?Most recent vital signs: ?Vitals:  ? 09/10/21 1400 09/10/21 1430  ?BP: (!) 165/72 (!) 160/87  ?Pulse: 70 76  ?Resp: 20 12  ?Temp:    ?SpO2: 96% 96%  ? ? ? ?General: Awake, no distress.  ?Head normocephalic atraumatic ?Eyes pupils equal round reactive extraocular movements intact fundi appear normal ?Neck is supple and nontender ?CV:  Good peripheral  perfusion.  Heart regular rate and rhythm no audible murmurs ?Resp:  Normal effort.  Lungs are clear ?Abd:  No distention.  Soft and nontender no organomegaly palpable ?Extremities: No edema ?Neuro cranial nerves II through XII are intact although visual fields were not checked.  Cerebellar finger-nose heel-to-shin and rapid alternating movements and hands are all normal.  Motor strength is 5/5 throughout and patient does not report any numbness.  Patient had slight vertiginous symptoms but no nystagmus when she was laid backwards and head was turned to 1 side and then to the other. ? ? ?ED Results / Procedures / Treatments  ? ?Labs ?(all labs ordered are listed, but only abnormal results are displayed) ?Labs Reviewed  ?COMPREHENSIVE METABOLIC PANEL - Abnormal; Notable for the following components:  ?    Result Value  ? Chloride 113 (*)   ? CO2 21 (*)   ? Glucose, Bld 118 (*)   ? BUN 24 (*)   ? All other components within normal limits  ?URINALYSIS, ROUTINE W REFLEX MICROSCOPIC -  Abnormal; Notable for the following components:  ? Color, Urine AMBER (*)   ? APPearance HAZY (*)   ? Protein, ur 30 (*)   ? Bacteria, UA RARE (*)   ? All other components within normal limits  ?CBC WITH DIFFERENTIAL/PLATELET  ?TROPONIN I (HIGH SENSITIVITY)  ?TROPONIN I (HIGH SENSITIVITY)  ? ? ? ?EKG ? ?EKG read interpreted by me shows normal sinus rhythm rate of 71 normal axis no acute ST-T wave changes ? ? ?RADIOLOGY ?Chest x-ray read by radiology and reviewed the films reviewed by me show apparent progression of the cancer although pneumonia cannot be ruled out CT was requested by radiology and ordered by me. ?CT head read by radiology films reviewed by me show progressively progression of hypodense area in the brain.  Discussed this in detail with the radiologist as the patient's daughter is with him and admits ports that he had an MRI of the brain in March that was difficult to obtain and patient actually got more hyper with Ativan.   Radiologist is very worried though and wants the MRI with contrast.  I have ordered it.  I will give the patient a little bit of Haldol IV I discussed this with the patient's daughter. ? ? ?PROCEDURES: ? ?Critical Care performed:  ? ?Procedures ? ? ?MEDICATIONS ORDERED IN ED: ?Medications - No data to display ? ? ?IMPRESSION / MDM / ASSESSMENT AND PLAN / ED COURSE  ?I reviewed the triage vital signs and the nursing notes. ?Patient cannot tolerate liquids and soft food.  I have advised her to either cut off everything very well in small pieces and chew it well or else use mechanically soft diet like mashed potatoes or applesauce and grind up any meat or even to use baby food. ?I have texted with Dr. Alice Reichert who will speak with the patient as soon as they call him and try to work out something so she can be seen sooner.  In the meantime she will do the mechanically soft diet as I discussed above. ?Not sure what is causing the patient's intermittent lightheadedness but.  She does not look like she is dry or severely hypotensive her BUN is somewhat elevated.  Perhaps drinking more fluids will help with this feeling.  There is no sign of any infection white count is normal differential is normal she is not anemic.  Her electrolytes look good and the urine is clear.  Specific gravity is somewhat high though.  MRI of the brain does not show any lesions which would explain her symptoms. ? ?The patient is on the cardiac monitor to evaluate for evidence of arrhythmia and/or significant heart rate changes.  Nothing that would explain her symptoms were seen. ? ? ? ?FINAL CLINICAL IMPRESSION(S) / ED DIAGNOSES  ? ?Final diagnoses:  ?Dizziness  ?Dysphagia, unspecified type  ? ? ? ?Rx / DC Orders  ? ?ED Discharge Orders   ? ? None  ? ?  ? ? ? ?Note:  This document was prepared using Dragon voice recognition software and may include unintentional dictation errors. ?  ?Nena Polio, MD ?09/10/21 1605 ? ?

## 2021-09-10 NOTE — ED Provider Notes (Signed)
? ?  [facility] ?Provider Note ? ? ? Event Date/Time  ? First MD Initiated Contact with Patient 09/10/21 0902   ?  (approximate) ? ? ?History  ? ?Dizziness and Fall ?There is no patient in room 17 at this time which is 9:05 AM. ? ?HPI ? ? ?  ? ? ?Physical Exam  ? ?Triage Vital Signs: ?ED Triage Vitals  ?Enc Vitals Group  ?   BP 09/10/21 0903 (!) 172/87  ?   Pulse Rate 09/10/21 0903 78  ?   Resp 09/10/21 0903 17  ?   Temp 09/10/21 0903 98.5 ?F (36.9 ?C)  ?   Temp Source 09/10/21 0903 Oral  ?   SpO2 09/10/21 0903 100 %  ?   Weight 09/10/21 0859 120 lb (54.4 kg)  ?   Height 09/10/21 0859 '5\' 4"'$  (1.626 m)  ?   Head Circumference --   ?   Peak Flow --   ?   Pain Score 09/10/21 0859 0  ?   Pain Loc --   ?   Pain Edu? --   ?   Excl. in [place]? --   ? ? ?Most recent vital signs: ?Vitals:  ? 09/10/21 0903  ?BP: (!) 172/87  ?Pulse: 78  ?Resp: 17  ?Temp: 98.5 ?F (36.9 ?C)  ?SpO2: 100%  ? ? ? ? ? ?ED Results / Procedures / Treatments  ? ?Labs ?(all labs ordered are listed, but only abnormal results are displayed) ?Labs Reviewed - No data to display ? ? ? ?PROCEDURES: ? ?Critical Care performed:  ? ?Procedures ? ? ?MEDICATIONS ORDERED IN ED: ?Medications - No data to display ? ? ?IMPRESSION / MDM / ASSESSMENT AND PLAN / ED COURSE  ?I reviewed the triage vital signs and the nursing notes. ? ? ? ? ? ? ? ?FINAL CLINICAL IMPRESSION(S) / ED DIAGNOSES  ? ?Final diagnoses:  ?Dizziness  ? ? ? ?Rx / DC Orders  ? ?ED Discharge Orders   ? ? None  ? ?  ? ? ? ?Note:  This document was prepared using Dragon voice recognition software and may include unintentional dictation errors. ?  ?Nena Polio, MD ?09/10/21 [phone] ? ?

## 2024-05-17 ENCOUNTER — Emergency Department
Admission: EM | Admit: 2024-05-17 | Discharge: 2024-05-17 | Disposition: A | Discharge status: Home / Self Care | Attending: Emergency Medicine | Admitting: Emergency Medicine

## 2024-05-17 ENCOUNTER — Emergency Department

## 2024-05-17 DIAGNOSIS — Z85038 Personal history of other malignant neoplasm of large intestine: Secondary | ICD-10-CM | POA: Diagnosis not present

## 2024-05-17 DIAGNOSIS — R0789 Other chest pain: Secondary | ICD-10-CM | POA: Insufficient documentation

## 2024-05-17 DIAGNOSIS — E860 Dehydration: Secondary | ICD-10-CM | POA: Diagnosis present

## 2024-05-17 DIAGNOSIS — N3090 Cystitis, unspecified without hematuria: Secondary | ICD-10-CM | POA: Insufficient documentation

## 2024-05-17 DIAGNOSIS — I10 Essential (primary) hypertension: Secondary | ICD-10-CM | POA: Diagnosis not present

## 2024-05-17 DIAGNOSIS — F0390 Unspecified dementia without behavioral disturbance: Secondary | ICD-10-CM | POA: Diagnosis not present

## 2024-05-17 LAB — COMPREHENSIVE METABOLIC PANEL WITH GFR
ALT: 26 U/L (ref 0–44)
AST: 70 U/L — ABNORMAL HIGH (ref 15–41)
Albumin: 4 g/dL (ref 3.5–5.0)
Alkaline Phosphatase: 70 U/L (ref 38–126)
Anion gap: 15 (ref 5–15)
BUN: 24 mg/dL — ABNORMAL HIGH (ref 8–23)
CO2: 22 mmol/L (ref 22–32)
Calcium: 9.7 mg/dL (ref 8.9–10.3)
Chloride: 104 mmol/L (ref 98–111)
Creatinine, Ser: 0.86 mg/dL (ref 0.44–1.00)
GFR, Estimated: >60 mL/min
Glucose, Bld: 146 mg/dL — ABNORMAL HIGH (ref 70–99)
Potassium: 3.5 mmol/L (ref 3.5–5.1)
Sodium: 141 mmol/L (ref 135–145)
Total Bilirubin: 1.3 mg/dL — ABNORMAL HIGH (ref 0.0–1.2)
Total Protein: 7.7 g/dL (ref 6.5–8.1)

## 2024-05-17 LAB — CBC
HCT: 37.6 % (ref 36.0–46.0)
Hemoglobin: 13.2 g/dL (ref 12.0–15.0)
MCH: 32.6 pg (ref 26.0–34.0)
MCHC: 35.1 g/dL (ref 30.0–36.0)
MCV: 92.8 fL (ref 80.0–100.0)
Platelets: 142 K/uL — ABNORMAL LOW (ref 150–400)
RBC: 4.05 MIL/uL (ref 3.87–5.11)
RDW: 12.6 % (ref 11.5–15.5)
WBC: 7.4 K/uL (ref 4.0–10.5)
nRBC: 0 % (ref 0.0–0.2)

## 2024-05-17 LAB — URINALYSIS, ROUTINE W REFLEX MICROSCOPIC
Bilirubin Urine: NEGATIVE
Glucose, UA: NEGATIVE mg/dL
Ketones, ur: NEGATIVE mg/dL
Nitrite: NEGATIVE
Protein, ur: 100 mg/dL — AB
Specific Gravity, Urine: 1.02 (ref 1.005–1.030)
WBC, UA: >50 WBC/hpf (ref 0–5)
pH: 5 (ref 5.0–8.0)

## 2024-05-17 MED ORDER — CEPHALEXIN 500 MG PO CAPS
500.0000 mg | ORAL_CAPSULE | Freq: Once | ORAL | Status: AC
  Administered 2024-05-17: 500 mg via ORAL
  Filled 2024-05-17: qty 1

## 2024-05-17 MED ORDER — SODIUM CHLORIDE 0.9 % IV BOLUS
1000.0000 mL | Freq: Once | INTRAVENOUS | Status: AC
  Administered 2024-05-17: 1000 mL via INTRAVENOUS

## 2024-05-17 MED ORDER — CEPHALEXIN 500 MG PO CAPS: 500.0000 mg | ORAL_CAPSULE | Freq: Two times a day (BID) | ORAL | 0 refills | Status: AC

## 2024-05-17 NOTE — ED Triage Notes (Signed)
 Pt presents to the ED via POV from home with daughter. Pt reports a fall this morning. Pt reports that her friends found her on the floor. Pt reports two falls this morning. Denies hitting her head. Does not take a blood thinner. Pt A&Ox2-3. Pt has had a cognitive decline the past few years per daughter. Reports no definitive diagnosis of dementia, but states that it is suspected. Reports pt is at cognitive baseline. Pt is more weak than normal.

## 2024-05-17 NOTE — ED Provider Notes (Signed)
 "  [facility] Provider Note    Event Date/Time   First MD Initiated Contact with Patient 05/17/24 1857     (approximate)   History   Chief Complaint: Fall   HPI  Tiffany Stafford is a 84 y.o. female with a history of hypertension, suspected dementia is brought to the ED due to fall this morning.  Family report the patient has had poor appetite, poor oral intake and also minimal fluid intake before coming to the ED today over the last several days.  During this time, she has been fatigued, and had 2 falls at home related to standing.  Patient denies chest pain or shortness of breath.  Does report some pain in the left upper chest wall that she felt when EMS helped pick her up off the bathroom floor.        Past Medical History:  Diagnosis Date   Benign esophageal stricture    Cancer (HCC)    colon cancer   DDD (degenerative disc disease), cervical    GERD (gastroesophageal reflux disease)    Glaucoma    Hyperlipidemia    Hypertension    Currently OK without meds   Neuropathy    PONV (postoperative nausea and vomiting)    Nausea after 1st cataract surgery   Subdural hematoma (HCC) 2/10   Wears dentures    full upper    Current Outpatient Rx   Order #: [id number] Class: Normal    Past Surgical History:  Procedure Laterality Date   ABDOMINAL HYSTERECTOMY     APPENDECTOMY     CATARACT EXTRACTION W/PHACO Right 09/13/2019   Procedure: CATARACT EXTRACTION PHACO AND INTRAOCULAR LENS PLACEMENT (IOC) RIGHT 5.21  00:39.3;  Surgeon: Jaye Fallow, MD;  Location: [facility];  Service: Ophthalmology;  Laterality: Right;   CATARACT EXTRACTION W/PHACO Left 10/04/2019   Procedure: CATARACT EXTRACTION PHACO AND INTRAOCULAR LENS PLACEMENT (IOC) LEFT 6.80  00:39.3;  Surgeon: Jaye Fallow, MD;  Location: [facility];  Service: Ophthalmology;  Laterality: Left;   CHOLECYSTECTOMY     COLON SURGERY     COLONOSCOPY WITH PROPOFOL  N/A 09/17/2015    Procedure: COLONOSCOPY WITH PROPOFOL ;  Surgeon: Deward CINDERELLA Piedmont, MD;  Location: [facility];  Service: Gastroenterology;  Laterality: N/A;   ESOPHAGOGASTRODUODENOSCOPY (EGD) WITH PROPOFOL  N/A 09/17/2015   Procedure: ESOPHAGOGASTRODUODENOSCOPY (EGD) WITH PROPOFOL ;  Surgeon: Deward CINDERELLA Piedmont, MD;  Location: [facility];  Service: Gastroenterology;  Laterality: N/A;   ORIF ANKLE FRACTURE Left 04/17/2015   Procedure: OPEN REDUCTION INTERNAL FIXATION (ORIF) ANKLE FRACTURE;  Surgeon: Ozell Flake, MD;  Location: [facility];  Service: Orthopedics;  Laterality: Left;    Physical Exam   Triage Vital Signs: ED Triage Vitals  Encounter Vitals Group     BP 05/17/24 1433 (!) 172/80     Girls Systolic BP Percentile --      Girls Diastolic BP Percentile --      Boys Systolic BP Percentile --      Boys Diastolic BP Percentile --      Pulse Rate 05/17/24 1433 93     Resp 05/17/24 1433 16     Temp 05/17/24 1433 98.6 F (37 C)     Temp Source 05/17/24 1433 Oral     SpO2 05/17/24 1433 99 %     Weight 05/17/24 1433 114 lb (51.7 kg)     Height 05/17/24 1433 5' 5 (1.651 m)     Head Circumference --      Peak Flow --  Pain Score 05/17/24 1438 0     Pain Loc --      Pain Education --      Exclude from Growth Chart --     Most recent vital signs: Vitals:   05/17/24 1946 05/17/24 2002  BP: (!) 141/69   Pulse: 79   Resp: 16   Temp:  98.9 F (37.2 C)  SpO2: 100%     General: Awake, no distress.  CV:  Good peripheral perfusion.  Regular rate rhythm Resp:  Normal effort.  Clear lungs Abd:  No distention.  Soft nontender Other:  Head atraumatic.  No midline spinal tenderness.  There is tenderness of the left proximal humerus as well as the left upper chest wall over the third rib.  No crepitus .  No lacerations.  Dry oral mucosa   ED Results / Procedures / Treatments   Labs (all labs ordered are listed, but only abnormal results are displayed) Labs Reviewed  COMPREHENSIVE METABOLIC PANEL WITH GFR -  Abnormal; Notable for the following components:      Result Value   Glucose, Bld 146 (*)    BUN 24 (*)    AST 70 (*)    Total Bilirubin 1.3 (*)    All other components within normal limits  CBC - Abnormal; Notable for the following components:   Platelets 142 (*)    All other components within normal limits  URINALYSIS, ROUTINE W REFLEX MICROSCOPIC - Abnormal; Notable for the following components:   Color, Urine AMBER (*)    APPearance CLOUDY (*)    Hgb urine dipstick LARGE (*)    Protein, ur 100 (*)    Leukocytes,Ua SMALL (*)    Bacteria, UA MANY (*)    All other components within normal limits  URINE CULTURE  CBG MONITORING, ED     EKG Interpreted by me Sinus rhythm rate of 90.  Normal axis and intervals.  No acute ischemic changes.   RADIOLOGY Chest x-ray interpreted by me, unremarkable.  Radiology report reviewed.  X-ray left humerus negative for fracture   PROCEDURES:  Procedures   MEDICATIONS ORDERED IN ED: Medications  sodium chloride  0.9 % bolus 1,000 mL (1,000 mLs Intravenous New Bag/Given 05/17/24 2001)  cephALEXin  (KEFLEX ) capsule 500 mg (500 mg Oral Given 05/17/24 2032)     IMPRESSION / MDM / ASSESSMENT AND PLAN / ED COURSE  I reviewed the triage vital signs and the nursing notes.  DDx: Dehydration, UTI, anemia, AKI, electrolyte derangement, rib fracture, humerus fracture  Patient's presentation is most consistent with acute presentation with potential threat to life or bodily function.  Patient presents with fatigue, poor oral intake at home, appears dehydrated.  This appears to be related to likely underlying dementia and loss of thirst and appetite drive.  Lab panel also reveals UTI.  She is nontoxic, stable with unremarkable vital signs.  Suitable for outpatient management on oral antibiotics.       FINAL CLINICAL IMPRESSION(S) / ED DIAGNOSES   Final diagnoses:  Cystitis  Dehydration  Chronic dementia (HCC)     Rx / DC Orders   ED  Discharge Orders     None        Note:  This document was prepared using Dragon voice recognition software and may include unintentional dictation errors.   Viviann Pastor, MD 05/17/24 2104  "

## 2024-05-19 LAB — URINE CULTURE: Culture: >=100000 — AB
